# Patient Record
Sex: Female | Born: 1970 | Hispanic: No | Smoking: Never smoker
Health system: Southern US, Community
[De-identification: ages and names within clinical notes are randomized; demographics above are authoritative.]

## PROBLEM LIST (undated history)

## (undated) DIAGNOSIS — I639 Cerebral infarction, unspecified: Secondary | ICD-10-CM

## (undated) DIAGNOSIS — R51 Headache: Secondary | ICD-10-CM

## (undated) DIAGNOSIS — R251 Tremor, unspecified: Secondary | ICD-10-CM

## (undated) DIAGNOSIS — E669 Obesity, unspecified: Secondary | ICD-10-CM

## (undated) DIAGNOSIS — IMO0002 Reserved for concepts with insufficient information to code with codable children: Secondary | ICD-10-CM

## (undated) DIAGNOSIS — G4700 Insomnia, unspecified: Secondary | ICD-10-CM

## (undated) DIAGNOSIS — M1990 Unspecified osteoarthritis, unspecified site: Secondary | ICD-10-CM

## (undated) DIAGNOSIS — F418 Other specified anxiety disorders: Secondary | ICD-10-CM

## (undated) DIAGNOSIS — G8929 Other chronic pain: Secondary | ICD-10-CM

## (undated) DIAGNOSIS — F419 Anxiety disorder, unspecified: Secondary | ICD-10-CM

## (undated) DIAGNOSIS — H9190 Unspecified hearing loss, unspecified ear: Secondary | ICD-10-CM

## (undated) DIAGNOSIS — IMO0001 Reserved for inherently not codable concepts without codable children: Principal | ICD-10-CM

## (undated) DIAGNOSIS — R519 Headache, unspecified: Secondary | ICD-10-CM

## (undated) DIAGNOSIS — R569 Unspecified convulsions: Secondary | ICD-10-CM

## (undated) HISTORY — DX: Headache: R51

## (undated) HISTORY — DX: Anxiety disorder, unspecified: F41.9

## (undated) HISTORY — DX: Other chronic pain: G89.29

## (undated) HISTORY — PX: FRACTURE SURGERY: SHX138

## (undated) HISTORY — DX: Unspecified convulsions: R56.9

## (undated) HISTORY — DX: Other specified anxiety disorders: F41.8

## (undated) HISTORY — PX: ANKLE SURGERY: SHX546

## (undated) HISTORY — PX: TUBAL LIGATION: SHX77

## (undated) HISTORY — DX: Insomnia, unspecified: G47.00

## (undated) HISTORY — DX: Headache, unspecified: R51.9

## (undated) HISTORY — DX: Unspecified osteoarthritis, unspecified site: M19.90

## (undated) HISTORY — DX: Reserved for concepts with insufficient information to code with codable children: IMO0002

## (undated) HISTORY — DX: Unspecified hearing loss, unspecified ear: H91.90

## (undated) HISTORY — DX: Reserved for inherently not codable concepts without codable children: IMO0001

## (undated) HISTORY — DX: Obesity, unspecified: E66.9

## (undated) HISTORY — DX: Tremor, unspecified: R25.1

---

## 2004-05-01 ENCOUNTER — Ambulatory Visit: Payer: Self-pay | Admitting: Family Medicine

## 2004-05-17 ENCOUNTER — Emergency Department (HOSPITAL_COMMUNITY): Admission: EM | Admit: 2004-05-17 | Discharge: 2004-05-17 | Payer: Self-pay | Admitting: Family Medicine

## 2004-07-07 ENCOUNTER — Emergency Department (HOSPITAL_COMMUNITY): Admission: EM | Admit: 2004-07-07 | Discharge: 2004-07-07 | Payer: Self-pay | Admitting: Family Medicine

## 2005-08-11 ENCOUNTER — Emergency Department (HOSPITAL_COMMUNITY): Admission: EM | Admit: 2005-08-11 | Discharge: 2005-08-11 | Payer: Self-pay | Admitting: Emergency Medicine

## 2005-12-10 ENCOUNTER — Emergency Department (HOSPITAL_COMMUNITY): Admission: EM | Admit: 2005-12-10 | Discharge: 2005-12-10 | Payer: Self-pay | Admitting: Family Medicine

## 2006-06-25 ENCOUNTER — Emergency Department (HOSPITAL_COMMUNITY): Admission: EM | Admit: 2006-06-25 | Discharge: 2006-06-25 | Payer: Self-pay | Admitting: Emergency Medicine

## 2006-07-27 ENCOUNTER — Ambulatory Visit (HOSPITAL_COMMUNITY): Admission: RE | Admit: 2006-07-27 | Discharge: 2006-07-27 | Payer: Self-pay | Admitting: *Deleted

## 2007-07-23 ENCOUNTER — Emergency Department (HOSPITAL_COMMUNITY): Admission: EM | Admit: 2007-07-23 | Discharge: 2007-07-23 | Payer: Self-pay | Admitting: Emergency Medicine

## 2007-08-24 ENCOUNTER — Emergency Department (HOSPITAL_COMMUNITY): Admission: EM | Admit: 2007-08-24 | Discharge: 2007-08-24 | Payer: Self-pay | Admitting: Family Medicine

## 2008-02-18 ENCOUNTER — Emergency Department (HOSPITAL_COMMUNITY): Admission: EM | Admit: 2008-02-18 | Discharge: 2008-02-18 | Payer: Self-pay | Admitting: Emergency Medicine

## 2008-06-07 ENCOUNTER — Inpatient Hospital Stay (HOSPITAL_COMMUNITY): Admission: EM | Admit: 2008-06-07 | Discharge: 2008-06-10 | Payer: Self-pay | Admitting: Emergency Medicine

## 2008-09-09 ENCOUNTER — Emergency Department (HOSPITAL_COMMUNITY): Admission: EM | Admit: 2008-09-09 | Discharge: 2008-09-09 | Payer: Self-pay | Admitting: Emergency Medicine

## 2010-05-02 LAB — URINALYSIS, ROUTINE W REFLEX MICROSCOPIC
Glucose, UA: NEGATIVE mg/dL
Hgb urine dipstick: NEGATIVE
Specific Gravity, Urine: 1.021 (ref 1.005–1.030)

## 2010-05-02 LAB — URINE MICROSCOPIC-ADD ON

## 2010-05-02 LAB — POCT PREGNANCY, URINE: Preg Test, Ur: NEGATIVE

## 2010-05-05 LAB — CBC
MCHC: 34.7 g/dL (ref 30.0–36.0)
RDW: 12.3 % (ref 11.5–15.5)

## 2010-05-05 LAB — DIFFERENTIAL
Eosinophils Absolute: 0.1 10*3/uL (ref 0.0–0.7)
Lymphocytes Relative: 26 % (ref 12–46)
Lymphs Abs: 1.5 10*3/uL (ref 0.7–4.0)
Monocytes Relative: 5 % (ref 3–12)
Neutrophils Relative %: 68 % (ref 43–77)

## 2010-05-05 LAB — FOLATE RBC: RBC Folate: 648 ng/mL — ABNORMAL HIGH (ref 180–600)

## 2010-05-05 LAB — VITAMIN B12: Vitamin B-12: 369 pg/mL (ref 211–911)

## 2010-05-05 LAB — PHENYTOIN LEVEL, TOTAL: Phenytoin Lvl: <2.5 ug/mL — ABNORMAL LOW (ref 10.0–20.0)

## 2010-05-05 LAB — TISSUE CULTURE

## 2010-05-05 LAB — COMPREHENSIVE METABOLIC PANEL
ALT: 22 U/L (ref 0–35)
AST: 22 U/L (ref 0–37)
Calcium: 8.9 mg/dL (ref 8.4–10.5)
GFR calc Af Amer: >60 mL/min (ref >60)
Sodium: 137 mEq/L (ref 135–145)
Total Protein: 6.3 g/dL (ref 6.0–8.3)

## 2010-05-05 LAB — URINALYSIS, ROUTINE W REFLEX MICROSCOPIC
Glucose, UA: NEGATIVE mg/dL
Hgb urine dipstick: NEGATIVE
Protein, ur: NEGATIVE mg/dL
pH: 7.5 (ref 5.0–8.0)

## 2010-05-05 LAB — GLUCOSE, CAPILLARY: Glucose-Capillary: 88 mg/dL (ref 70–99)

## 2010-05-05 LAB — RAPID URINE DRUG SCREEN, HOSP PERFORMED
Amphetamines: NOT DETECTED
Barbiturates: NOT DETECTED
Opiates: NOT DETECTED

## 2010-05-05 LAB — TSH: TSH: 3.865 u[IU]/mL (ref 0.350–4.500)

## 2010-05-05 LAB — URINE MICROSCOPIC-ADD ON

## 2010-05-05 LAB — RPR: RPR Ser Ql: NONREACTIVE
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
## 2010-06-09 NOTE — Discharge Summary (Signed)
Heather Simon, Heather Simon                ACCOUNT NO.:  [id number]   MEDICAL RECORD NO.:  [id number]          PATIENT TYPE:  INP   LOCATION:  1311                         FACILITY:  [facility]   PHYSICIAN:  Hind I Elsaid, MD      DATE OF BIRTH:  18-Nov-1970   DATE OF ADMISSION:  06/06/2008  DATE OF DISCHARGE:  06/10/2008                               DISCHARGE SUMMARY   DISCHARGE DIAGNOSES:  1. New onset seizures.  2. Migraine headache.  3. Methicillin-resistant Staphylococcus aureus/gram negative rods, ear      canals.  4. 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   DISCHARGE MEDICATIONS:  1. Amitriptyline 50 mg p.o. daily.  2. Topamax 25 mg p.o. daily until May 21, then increase to 50 mg p.o.      until May 28, then 75 mg p.o. for 1 week, then 100 mg p.o. daily.  3. Bactrim DS 1 tab twice daily.  4. Percocet 5/325 mg p.o. q.4 to 6 hours p.r.n.  5. Cortisporin ear drop q.i.d.   CONSULTATION:  Neurology consulted and ENT consulted.   HISTORY OF PRESENT ILLNESS:  This is a 40 year old female with a history  of some depression having headache in the last couple days and had an  observed seizure at home.  The patient admitted for evaluation of  seizures.  Had EEG which showed abnormal EEG due to a slow wave complex  form the bifrontal.  This suggested lowered seizure threshold, overall  no electrographic seizure was recorded.  MRI of the brain with no acute  abnormality but did show left middle ear effusion, left mastoid sinus  effusion.  The right mastoid sinus is clear.  No nasopharyngeal mass is  seen that could be causing obstruction of the eustachian tube on the  left.  CT head did show a 6 mm hypodense lesion in the left parietal  periventricular white matter.  This may represent periventricular space.  Cannot exclude inflammatory focus or a small mass.  I would recommend  followup MRI of the brain with and without contrast.  There is some  fullness of  the cerebellar fossa and the foramen magnum.  She had a  malformation.  Left otitis media with multiple left mastoid air cells.   HOSPITAL COURSE:  1. New onset seizures.  The patient admitted under seizure      precautions.  Neurologic consulted.  Recommendation was MRI of the      brain and EEG.  The patient was apparently started on dilantin, but      secondary to severe headache, recommendation was to proceed with      Topamax.  The patient now on Topamax 25 mg daily.  The patient had      what felt like another seizure on May 16 described as light      shocking, light headache, with no loss of consciousness and no      clear stimulation or confusion.  For that, discharge was held for      24 hours with no further episode, and  neurology evaluated the      patient and recommended to continue with the current management of      nausea and continue Topamax as well.  The patient, today, more      awake.  Has no further symptoms.  The patient will be discharged      with Topamax and Elavil, and need to follow up with a neurologist      within 4 weeks.  2. otitis media and mastoiditis on  the left.  ENT seen.  Reviewed the      consult.  Culture did grow staphylococcus aureus with some gram      negative rods.  We avoid in this case Avelox and Levaquin because      of decrease of seizure threshold.  For that, the patient will be      discharged with Bactrim with Cortisporin ear drop.      Arrangement was made to see Dr. Lazarus Salines from ENT to see him after      completing a course of Bactrim and Cortisporin.  .  At this time,      the patient medically stable to be discharged home.  The patient      will need to follow up with neurology in 4 weeks.  Follow up with      ENT in 10 days.      Hind Bosie Helper, MD  Electronically Signed     HIE/MEDQ  D:  06/10/2008  T:  06/10/2008  Job:  [id number]

## 2010-06-09 NOTE — Consult Note (Signed)
NAMEMarland Kitchen  Heather Simon, Heather Simon NO.:  [id number]   MEDICAL RECORD NO.:  [id number]          PATIENT TYPE:  INP   LOCATION:  1311                         FACILITY:  [facility]   PHYSICIAN:  Heather Simon, M.D.  DATE OF BIRTH:  04/22/1970   DATE OF CONSULTATION:  06/07/2008  DATE OF DISCHARGE:                                 CONSULTATION   CHIEF COMPLAINT:  Seizures.   HISTORY OF PRESENT ILLNESS:  This is a pleasant 40 year old female who  is left handed.  The patient states that she has been under significant  stress over the last 4-6 months at work and at home.  The patient has  had a daily headache that is circumferential in nature, throbbing, and  has component of photophobia, phonophobia, and scotoma.  Scotoma is  described as white flashy lights in the left peripheral field.  The  patient states that the headaches do increase in severity throughout the  day, it is markedly worse by the end of work.  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  The patient states that Tylenol does not help in  decreasing the pain of her headaches.  She has recently been to [facility], received a medication for headaches, however, does  not recall what the name of the medication is.  She has had this  medication at this time.  She does not have a primary care Heather Simon and  has not followed up with any physician for her chronic headache.  Of  note, at the time that she had seen [facility], she was also  started on Paxil 10 mg daily for depression and anxiety.   Talking to the daughter, daughter states that the patient often times  come home, go into a dark room, put a towel over her head, and lay still  for significant periods of time.  Daughter also states that the patient  is under significant stress and is reluctant to seek help for both her  headache and anxiety.  In addition to this  headache component, the  patient also states that she has had 3-4 seizures in the past 4-6  months, last seizure that occurred was yesterday and prior to that was  approximately 2 weeks before.  All these seizures except for one have  been unwitnessed.  The one witnessed seizure occurred approximately 2  weeks ago in which her daughter states that her right hand started  shaking at that time.  At that time, the hand started shaking, the  patient tried to stop her hand from shaking and was aware that her hand  was shaking.  Daughter states that she noted her mother's eyes rolled  back.  There was no urinary incontinence.  No tongue biting.  No tonic-  clonic movement.  Seizure lasted for approximately 10 minutes with a  period of significant drowsiness and cnfusion,and significant tired  sensation.  This past most recent seizure that occurred yesterday, the  patient was found  at home by her son, after seizure event had occurred.  She does not recall any of the seizure event and does not recall the EMS  ride to the hospital.  At present time, she is alert, she is oriented,  and showing no signs of postictal or lethargy.   PAST MEDICAL HISTORY:  Significant for depression which she started on  Paxil, March 2010.   MEDICATIONS:  1. Paxil 10 mg daily.  2. Amitriptyline 25 mg every night.   ALLERGIES:  None.   FAMILY HISTORY:  Negative for hypertension, diabetes, CVA.  Positive for  benign essential tremor.   SOCIAL HISTORY:  The patient does not drink or use drugs.  Lives in  [place].  She has 2 children and often times open her house to  multiple other people.  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   REVIEW OF SYSTEMS:  Negative.   PHYSICAL EXAMINATION:  VITAL SIGNS:  Blood pressure is 108/66, pulse  120, respiratory rate 20, and temperature 97.8.  MENTAL  STATUS:  She is alert.  She is oriented.  She carries out 2 and 3-  step commands without any difficulty.  She is well enunciated.  She has  good recall.  PULMONARY:  Clear to auscultation.  CARDIOVASCULAR:  S1 and S2.  Regular rate and rhythm.  NECK:  Negative bruits and supple.  ABDOMEN:  Soft, nontender, nondistended.  NEUROLOGIC:  Cranial nerves II-XII are grossly intact.  Pupils are  equal, round, reactive and accommodating to light approximately 3 mm to  2 mm conjugate.  Extraocular muscles are grossly intact.  Visual fields  are grossly intact.  Symmetrical face.  Tongue is midline.  Uvula is  midline.  Sensation V1 through V3 is full.  Pinprick to light touch.  Shoulder shrug and head turn is full, within normal range.  No  meningismus noted.  Coordination; cerebellar; finger-to-nose, and heel-  to-shin are smooth with no dysmetria.  Fine motor movements are within  normal limits.  Gait is slow, however, she has good arm swing in a  narrow base.  Motor is 5/5 globally.  She does have a small fast Hertz  tremor in her left hand and states this often occurs when she is  nervous, but does not occur when she is at rest.  The patient shows no  muscle wasting or fasciculations at this time.   Deep tendon reflexes are 2+ throughout with bilateral downgoing toes.  Drift is negative.  Sensation is full to pinprick, light touch, and  vibration.  Proprioception and stereognosis are grossly intact.   LABS:  UA is positive for nitrites and leukocytes.  Sodium is 137,  potassium 4.0, chloride 107, CO2 is 23, BUN is 15, creatinine is 0.75,  glucose is 101, white blood cell 6.0, platelets 201,  hemoglobin/hematocrit 13.2 and 38.2.   IMAGING TEST:  CT of the head shows a 6-mm hypodense lesion in the left  periventricular region, mostly likely lacunar infarct of unknown onset,  old lacunar infarct.   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  At this time, we would definitely recommend EEG.  We discontinued Cipro  and changed to Bactrim DS for urinary tract infection.  Cipro can  decrease seizure threshold and would also add Topamax 50 mg daily which  can both aid in headache and seizure prophylaxis.  I will discuss this  with Dr. Anne Hahn.      ______________________________  Felicie Morn, PA-C      C. Lesia Sago, M.D.  Electronically Signed   DS/MEDQ  D:  06/07/2008  T:  06/08/2008  Job:  [id number]   cc:   Heather Simon, M.D.  Fax: [phone]

## 2010-06-09 NOTE — Procedures (Signed)
EEG NUMBER:  [id number]   HISTORY:  This is a 40 year old patient with history of headaches and  seizure-type events.  The patient reports three seizures in the last 2  months.  The patient to be evaluated for these seizures.  Medications  include Elavil, Augmentin, Dilaudid, Tylenol.   EEG CLASSIFICATION:  Dysrhythmia grade 3 bifrontal.   DESCRIPTION OF THE RECORDING:  Background rhythm of this recording  consists of fairly well-modulated medium amplitude alpha rhythm of 10 Hz  that is reactive to eye opening and closure.  As the record progresses,  the patient appears to be drowsy off and on during the recording, but on  one occasion, there appears to be sharp and slow wave complexes  emanating from the frontal regions bilaterally.  This is not seen again  throughout the recording.  Photic stimulation and hyperventilation were  not performed.  In no time during the recording does the patient  appeared to enter stage II sleep or does there appear to be evidence of  spikes or does there appear to be evidence of focal slowing.  EKG  monitor shows no evidence of cardiac rhythm abnormalities with a heart  rate of 90.   IMPRESSION:  This is an abnormal EEG recording due to sharp and slow  wave complexes emanating from the bifrontal regions.  This study would  suggest a lowered seizure threshold, although no electrographic seizures  were recorded during this study.      Marlan Palau, M.D.  Electronically Signed     JJO:ACZY  D:  06/07/2008 17:09:16  T:  06/08/2008 04:48:18  Job #:  [id number]

## 2010-06-09 NOTE — H&P (Signed)
NAME:  Heather Simon, Heather Simon NO.:  [id number]   MEDICAL RECORD NO.:  [id number]          PATIENT TYPE:  INP   LOCATION:  1311                         [facility]ACILITY:  [facility]   PHYSICIAN:  Lonia Blood, M.D.      DATE OF BIRTH:  [birth year]   DATE OF ADMISSION:  06/06/2008  DATE OF DISCHARGE:                              HISTORY & PHYSICAL   PRIMARY CARE PHYSICIAN:  The patient is unassigned to [place].   PRESENTING COMPLAINT:  Seizure.   HISTORY OF PRESENT ILLNESS:  The patient is a 40 year old female with  history of some depression who has apparently been having headaches  repeatedly in the last couple of days and had an observed seizure at  home.  Per patient, she has had at least 3 different seizure episodes in  the last 2 months but ignored them, not going to see any physician.  She  denied any fever.  Currently awake, alert, no postictal symptoms.  Able  to communicate.  Her main issue is the headache that is severe and  continuous.  She had no observed seizure in the ED.  Neurology has  already been consulted, and Dr. Vickey Huger promised to see the patient in  the morning.   Her past medical history is significant for depression and headache.   ALLERGIES:  She has no known drug allergies.   MEDICATIONS:  Include:  1. Paxil 10 mg daily.  2. Amitriptyline 25 mg every night.   SOCIAL HISTORY:  The patient lives in [place].  She denied any  tobacco, alcohol or IV drug use.   FAMILY HISTORY:  Denied any family history of cancers or any brain  tumors.   REVIEW OF SYSTEMS:  A 12-point review of systems is negative except per  HPI.   PHYSICAL EXAMINATION:  Temperature 98.3, blood pressure 130/79, initial  pulse 102, respiratory rate 20, saturations 100% on room air.  GENERAL:  The patient is currently awake, alert, oriented, communicating  in no acute distress.  HEENT:  PERRL.  EOMI.  NECK:  Is supple.  No JVD.  No lymphadenopathy.  RESPIRATORY:  She has good air entry  bilaterally.  No wheezes.  CARDIOVASCULAR SYSTEM:  The patient has S1-S2.  No murmur.  ABDOMEN:  Soft and nontender with positive bowel sounds.  EXTREMITIES:  No edema, cyanosis or clubbing.   LABORATORY DATA:  White count 6.0, hemoglobin 13.2 with platelet count  of 201.  Sodium 137, potassium 4.7, chloride 107, CO2 23, glucose 101,  BUN 15, creatinine 0.75, normal differentials.  Normal LFTs with a  calcium of 8.9.  Alcohol is less than 5.  Urine drug screen is negative.  Urinalysis showed cloudy urine with positive leukocyte esterase which is  moderate.  Urine WBC 0-2 with many bacteria.  Urine pregnancy is  negative.  Head CT without contrast showed a 6-mm hypodense lesion in  the left parietal periventricular white matter which may represent a  dilated perivascular space or small remote lacunar infarct.  A small  mass may be possible.  Recommendation for an MRI.  The patient also  has  some fullness of the cerebellar tonsils at the foramen magnum as well as  left otitis media with opacification of multiple left mastoid air cells.   ASSESSMENT:  Therefore, this is a 40 year old female presenting with  what appears to be new-onset seizure within the last 2 months.  The  cause is clearly unknown but there is a possibility of brain mass.  If  so, this could be the focal point for her seizure.  Also has depression  and possible UTI.  The severe headaches could be related to her  intracranial lesion or could be some type of complex migraine.   PLAN:  Therefore will be:  1. New-onset seizure.  The patient has already had Dilantin in the ED.      Will check a level today.  Will continue on oral Dilantin every      night.  Neurology has been consulted, and Dr. Vickey Huger promised to      see the patient this morning and will wait for her to do a formal      consult.  2. Possible brain mass.  We will proceed with MRI to evaluate the      lesion seen on CT.  3. Depression.  Will continue with  her home therapy as much as      possible.  4. Insomnia.  I will continue with Elavil.  5. Headaches.  Will continue with pain control using both Tylenol as      well as Dilaudid as needed.  6. Bacteriuria.  The patient is asymptomatic.  However, at her age,      she is a little bit too young for asymptomatic bacteriuria.  With a      moderate leukocyte esterase, I will treat her empirically with oral      Cipro for 3 days.  Further treatment will depend on the results of      MRI.      Lonia Blood, M.D.  Electronically Signed     LG/MEDQ  D:  06/07/2008  T:  06/07/2008  Job:  [id number]

## 2010-10-23 LAB — WET PREP, GENITAL
Clue Cells Wet Prep HPF POC: NONE SEEN
Yeast Wet Prep HPF POC: NONE SEEN

## 2010-10-23 LAB — POCT URINALYSIS DIP (DEVICE)
Glucose, UA: NEGATIVE
Ketones, ur: 15 — AB
Operator id: 235561
Specific Gravity, Urine: >=1.03

## 2010-10-23 LAB — POCT PREGNANCY, URINE: Preg Test, Ur: NEGATIVE

## 2010-10-23 LAB — GC/CHLAMYDIA PROBE AMP, GENITAL
Chlamydia, DNA Probe: NEGATIVE
GC Probe Amp, Genital: NEGATIVE

## 2010-12-04 ENCOUNTER — Other Ambulatory Visit: Payer: Self-pay | Admitting: Family Medicine

## 2010-12-04 DIAGNOSIS — Z1231 Encounter for screening mammogram for malignant neoplasm of breast: Secondary | ICD-10-CM

## 2010-12-23 ENCOUNTER — Other Ambulatory Visit (HOSPITAL_COMMUNITY)
Admission: RE | Admit: 2010-12-23 | Discharge: 2010-12-23 | Disposition: A | Discharge status: Home / Self Care | Payer: 59 | Source: Ambulatory Visit | Attending: Family Medicine | Admitting: Family Medicine

## 2010-12-23 DIAGNOSIS — Z01419 Encounter for gynecological examination (general) (routine) without abnormal findings: Secondary | ICD-10-CM | POA: Insufficient documentation

## 2012-04-26 ENCOUNTER — Encounter (HOSPITAL_COMMUNITY): Payer: Self-pay | Admitting: Emergency Medicine

## 2012-04-26 ENCOUNTER — Emergency Department (HOSPITAL_COMMUNITY)
Admission: EM | Admit: 2012-04-26 | Discharge: 2012-04-26 | Disposition: A | Discharge status: Home / Self Care | Payer: BC Managed Care – PPO | Attending: Emergency Medicine | Admitting: Emergency Medicine

## 2012-04-26 ENCOUNTER — Emergency Department (HOSPITAL_COMMUNITY): Payer: BC Managed Care – PPO

## 2012-04-26 DIAGNOSIS — Z8673 Personal history of transient ischemic attack (TIA), and cerebral infarction without residual deficits: Secondary | ICD-10-CM | POA: Insufficient documentation

## 2012-04-26 DIAGNOSIS — R059 Cough, unspecified: Secondary | ICD-10-CM | POA: Insufficient documentation

## 2012-04-26 DIAGNOSIS — R0609 Other forms of dyspnea: Secondary | ICD-10-CM | POA: Insufficient documentation

## 2012-04-26 DIAGNOSIS — R05 Cough: Secondary | ICD-10-CM | POA: Insufficient documentation

## 2012-04-26 DIAGNOSIS — R0989 Other specified symptoms and signs involving the circulatory and respiratory systems: Secondary | ICD-10-CM | POA: Insufficient documentation

## 2012-04-26 DIAGNOSIS — R509 Fever, unspecified: Secondary | ICD-10-CM | POA: Insufficient documentation

## 2012-04-26 HISTORY — DX: Cerebral infarction, unspecified: I63.9

## 2012-04-26 MED ORDER — ALBUTEROL SULFATE HFA 108 (90 BASE) MCG/ACT IN AERS
2.0000 | INHALATION_SPRAY | RESPIRATORY_TRACT | Status: DC | PRN
  Filled 2012-04-26: qty 6.7

## 2012-04-26 MED ORDER — ACETAMINOPHEN 325 MG PO TABS
650.0000 mg | ORAL_TABLET | Freq: Once | ORAL | Status: AC
  Administered 2012-04-26: 650 mg via ORAL
  Filled 2012-04-26: qty 2

## 2012-04-26 MED ORDER — PREDNISONE 20 MG PO TABS: 40.0000 mg | ORAL_TABLET | Freq: Every day | ORAL | Status: DC

## 2012-04-26 MED ORDER — ALBUTEROL SULFATE (5 MG/ML) 0.5% IN NEBU
2.5000 mg | INHALATION_SOLUTION | Freq: Once | RESPIRATORY_TRACT | Status: AC
  Administered 2012-04-26: 2.5 mg via RESPIRATORY_TRACT
  Filled 2012-04-26: qty 0.5

## 2012-04-26 MED ORDER — ALBUTEROL SULFATE HFA 108 (90 BASE) MCG/ACT IN AERS: 2.0000 | INHALATION_SPRAY | RESPIRATORY_TRACT | Status: DC | PRN

## 2012-04-26 MED ORDER — IPRATROPIUM BROMIDE 0.02 % IN SOLN
0.5000 mg | Freq: Once | RESPIRATORY_TRACT | Status: AC
  Administered 2012-04-26: 0.5 mg via RESPIRATORY_TRACT
  Filled 2012-04-26: qty 2.5

## 2012-04-26 NOTE — ED Notes (Signed)
Spoke with respiratory therapy regarding breathing treatment.

## 2012-04-26 NOTE — ED Provider Notes (Signed)
History     CSN: [id number]  Arrival date & time 04/26/12  1621   First MD Initiated Contact with Patient 04/26/12 1633      Chief Complaint  Patient presents with  . Shortness of Breath    (Consider location/radiation/quality/duration/timing/severity/associated sxs/prior treatment) HPI Comments: This is a 42 year old female, no pertinent past medical history, who presents emergency department with chief complaint of cough and shortness of breath. Patient states that 2 weeks ago she had fever, cough, and chills. She states that she got better, but that she has still been coughing. She states that for the past week or so, the cough has been worsening, and that for the past 4 days she has had difficulty breathing. She is tried taking Mucinex with no relief. She states that her level of discomfort is moderate. Currently, she denies fever, chills, or productive cough. She states that she has never smoked. She has no history of asthma.  The history is provided by the patient. No language interpreter was used.    Past Medical History  Diagnosis Date  . Stroke     Past Surgical History  Procedure Laterality Date  . Tubal ligation    . Ankle surgery      No family history on file.  History  Substance Use Topics  . Smoking status: Never Smoker   . Smokeless tobacco: Not on file  . Alcohol Use: No    OB History   Grav Para Term Preterm Abortions TAB SAB Ect Mult Living                  Review of Systems  All other systems reviewed and are negative.    Allergies  Review of patient's allergies indicates no known allergies.  Home Medications   Current Outpatient Rx  Name  Route  Sig  Dispense  Refill  . guaiFENesin (MUCINEX) 600 MG 12 hr tablet   Oral   Take 1,200 mg by mouth as needed for congestion.         Marland Kitchen ibuprofen (ADVIL,MOTRIN) 200 MG tablet   Oral   Take 200 mg by mouth daily.         . Multiple Vitamin (MULTIVITAMIN WITH MINERALS) TABS   Oral   Take  1 tablet by mouth daily.           BP 126/72  Pulse 103  Temp(Src) 98.9 F (37.2 C) (Oral)  SpO2 100%  LMP 03/30/2012  Physical Exam  Nursing note and vitals reviewed. Constitutional: She is oriented to person, place, and time. She appears well-developed and well-nourished.  HENT:  Head: Normocephalic and atraumatic.  Eyes: Conjunctivae and EOM are normal. Pupils are equal, round, and reactive to light.  Neck: Normal range of motion. Neck supple.  Cardiovascular: Normal rate, regular rhythm and normal heart sounds.  Exam reveals no gallop and no friction rub.   No murmur heard. Pulmonary/Chest: Effort normal and breath sounds normal. No respiratory distress. She has no wheezes. She has no rales. She exhibits no tenderness.  No wheezes, rhonchi or rales  Abdominal: Soft. Bowel sounds are normal. She exhibits no distension and no mass. There is no tenderness. There is no rebound and no guarding.  Musculoskeletal: Normal range of motion. She exhibits no edema and no tenderness.  Neurological: She is alert and oriented to person, place, and time.  Skin: Skin is warm and dry.  Psychiatric: She has a normal mood and affect. Her behavior is normal. Judgment and thought  content normal.    ED Course  Procedures (including critical care time)  Labs Reviewed - No data to display Dg Chest 2 View  04/26/2012  *RADIOLOGY REPORT*  Clinical Data: Shortness of breath  CHEST - 2 VIEW  Comparison: None.  Findings: Normal heart size.  No effusion or edema.  No airspace consolidation identified.  Indeterminant nodular density in the left upper lobe measures 9 mm.  Review of the visualized osseous structures is remarkable.  IMPRESSION:  1.  Left upper lobe nodule measures 9 mm.  Recommend follow-up, non emergent CT of the chest. 2.  No acute findings.   Original Report Authenticated By: Signa Kell, M.D.      1. Cough       MDM  42 year old female with cough and SOB.  Plain films show no  evidence of pneumonia.  Incidental 9mm nodule found on CXR, which will need CT for further evaluation.  This may be done on a non-emergent basis.  Patient feels better after the nebulizer treatment.  6:44 PM Discussed with Dr. Effie Shy, who tells me that I can give the patient a breathing treatment, and then send her home with a short steroid course and an inhaler.  I discussed the importance of following up with a PCP for a CT chest follow up.  Patient understands and agrees with the plan.        Roxy Horseman, PA-C 04/26/12 1850

## 2012-04-26 NOTE — ED Notes (Signed)
Patient transported to X-ray 

## 2012-04-26 NOTE — ED Notes (Signed)
Pt complains of non productive cough and shortness of breath. Pt reports that "a week ago I had flu like symptoms, that has gotten better, but then this started"

## 2012-04-26 NOTE — ED Notes (Signed)
MD at bedside. 

## 2012-04-27 NOTE — ED Provider Notes (Signed)
Medical screening examination/treatment/procedure(s) were performed by non-physician practitioner and as supervising physician I was immediately available for consultation/collaboration.  Flint Melter, MD 04/27/12 [phone]

## 2012-05-07 ENCOUNTER — Ambulatory Visit: Payer: BC Managed Care – PPO

## 2012-05-07 ENCOUNTER — Encounter: Payer: Self-pay | Admitting: Family Medicine

## 2012-05-07 ENCOUNTER — Ambulatory Visit (INDEPENDENT_AMBULATORY_CARE_PROVIDER_SITE_OTHER): Payer: BC Managed Care – PPO | Admitting: Family Medicine

## 2012-05-07 VITALS — BP 113/76 | HR 73 | Temp 97.9°F | Resp 16 | Ht 67.5 in | Wt 210.2 lb

## 2012-05-07 DIAGNOSIS — R918 Other nonspecific abnormal finding of lung field: Secondary | ICD-10-CM

## 2012-05-07 DIAGNOSIS — R9389 Abnormal findings on diagnostic imaging of other specified body structures: Secondary | ICD-10-CM

## 2012-05-07 DIAGNOSIS — M25552 Pain in left hip: Secondary | ICD-10-CM

## 2012-05-07 DIAGNOSIS — R220 Localized swelling, mass and lump, head: Secondary | ICD-10-CM

## 2012-05-07 DIAGNOSIS — M25559 Pain in unspecified hip: Secondary | ICD-10-CM

## 2012-05-07 DIAGNOSIS — Z803 Family history of malignant neoplasm of breast: Secondary | ICD-10-CM

## 2012-05-07 DIAGNOSIS — R221 Localized swelling, mass and lump, neck: Secondary | ICD-10-CM

## 2012-05-07 LAB — TSH: TSH: 1.552 u[IU]/mL (ref 0.350–4.500)

## 2012-05-07 LAB — POCT SEDIMENTATION RATE: POCT SED RATE: 20 mm/hr (ref 0–22)

## 2012-05-07 MED ORDER — METHYLPREDNISOLONE 4 MG PO KIT: PACK | ORAL | Status: DC

## 2012-05-07 NOTE — Progress Notes (Signed)
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 Heather Simon has 4 children age 20,19, 58,12. Her ex-husband does not help her in any way. Heather Simon has a sister has cervical cancer that has metastasized to her neck. Mother had breast cancer.  Patient was in the emergency room 2 weeks ago for a cough and at that time was found to have a nodule in the right upper lobe measuring 9 mm. Heather Simon was told Heather Simon needed a CAT scan.  Heather Simon's had left hip pain for month. This occurs in primarily at night but also when going up and downstairs. Heather Simon's had no injury. Heather Simon notes that Heather Simon has soreness in her fingers with some intermittent swelling of the PIP joints. Her father had rheumatoid arthritis. Patient has not had insurance until recently.  Patient's concern that Heather Simon may have breast cancer or some other form of cancer. Heather Simon noted back in October that Heather Simon had a nodule in her neck which is persisted. This has not enlarged recently. The area of the nodule is in the anterior left side at the insertion of the sternocleidomastoid muscle.  Patient's never had a mammogram.  Objective: Patient appears somewhat anxious but has good eye contact and is appropriate HEENT unremarkable Neck: Suggestion of half centimeter nodule at the base of the left sternocleidomastoid. No thyromegaly. No other adenopathy. Chest: Clear Heart: Regular no murmur Skin: Warm and dry Hip: Pain with internal and external rotation on the left side. Straight leg raising normal. Flexion and extension seem to be normal. Right hip is normal [facility] reading (PRIMARY) by  Dr. Milus Glazier:  No significant bony erosion, joint space narrowing or deformity  Assessment: 1 significant anxiety and stress 2 left hip pain most likely a form of arthritis 3 right upper lobe nodule in lung field 4 positive family history for breast and cervical cancer in patient needs screening.  Plan:  Abnormal finding on chest xray - Plan: CT Chest Wo  Contrast  Hip pain, left - Plan: DG Hip Complete Left, POCT SEDIMENTATION RATE, TSH, Rheumatoid factor, ANA  Neck nodule - Plan: TSH  Family history of breast cancer - Plan: MM Digital Screening   Results for orders placed in visit on 05/07/12  POCT SEDIMENTATION RATE      Result Value Range   POCT SED RATE 20  0 - 22 mm/hr

## 2012-05-07 NOTE — Progress Notes (Signed)
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  She was told she has a nodule in right chest 11 days ago in ED when she presented for a cough.    Patient is a single mom  She has had a month of left hip aching, sometimes waking her at night.  Worse with walking up and down stairs and at night.  No trauma hx.  Some finger aching and right hip sometimes aches.  Father had arthritis (rheumatoid)  She is concerned about the nodule and aching because her younger sister has been diagnosed with breast cancer

## 2012-05-07 NOTE — Patient Instructions (Signed)
Mammography Mammography is an X-ray of the breasts to look for changes that are not normal. The X-ray image is called a mammogram. This procedure can screen for breast cancer, can detect cancer early, and can diagnose cancer.  LET YOUR CAREGIVER KNOW ABOUT:  Breast implants.  Previous breast disease, biopsy, or surgery.  If you are breastfeeding.  Medicines taken, including vitamins, herbs, eyedrops, over-the-counter medicines, and creams.  Use of steroids (by mouth or creams).  Possibility of pregnancy, if this applies. RISKS AND COMPLICATIONS  Exposure to radiation, but at very low levels.  The results may be misinterpreted.  The results may not be accurate.  Mammography may lead to further tests.  Mammography may not catch certain cancers. BEFORE THE PROCEDURE  Schedule your test about 7 days after your menstrual period. This is when your breasts are the least tender and have signs of hormone changes.  If you have had a mammography done at a different facility in the past, get the mammogram X-rays or have them sent to your current exam facility in order to compare them.  Wash your breasts and under your arms the day of the test.  Do not wear deodorants, perfumes, or powders anywhere on your body.  Wear clothes that you can change in and out of easily. PROCEDURE Relax as much as possible during the test. Any discomfort during the test will be very brief. The test should take less than 30 minutes. The following will happen:  You will undress from the waist up and put on a gown.  You will stand in front of the X-ray machine.  Each breast will be placed between 2 plastic or glass plates. The plates will compress your breast for a few seconds.  X-rays will be taken from different angles of the breast. AFTER THE PROCEDURE  The mammogram will be examined.  Depending on the quality of the images, you may need to repeat certain parts of the test.  Ask when your test  results will be ready. Make sure you get your test results.  You may resume normal activities. Document Released: 01/09/2000 Document Revised: 04/05/2011 Document Reviewed: 11/01/2010 [facility] Patient Information 2013 [place], [place]. Health Maintenance, Females A healthy lifestyle and preventative care can promote health and wellness.  Maintain regular health, dental, and eye exams.  Eat a healthy diet. Foods like vegetables, fruits, whole grains, low-fat dairy products, and lean protein foods contain the nutrients you need without too many calories. Decrease your intake of foods high in solid fats, added sugars, and salt. Get information about a proper diet from your caregiver, if necessary.  Regular physical exercise is one of the most important things you can do for your health. Most adults should get at least 150 minutes of moderate-intensity exercise (any activity that increases your heart rate and causes you to sweat) each week. In addition, most adults need muscle-strengthening exercises on 2 or more days a week.   Maintain a healthy weight. The body mass index (BMI) is a screening tool to identify possible weight problems. It provides an estimate of body fat based on height and weight. Your caregiver can help determine your BMI, and can help you achieve or maintain a healthy weight. For adults 20 years and older:  A BMI below 18.5 is considered underweight.  A BMI of 18.5 to 24.9 is normal.  A BMI of 25 to 29.9 is considered overweight.  A BMI of 30 and above is considered obese.  Maintain normal blood lipids  and cholesterol by exercising and minimizing your intake of saturated fat. Eat a balanced diet with plenty of fruits and vegetables. Blood tests for lipids and cholesterol should begin at age 46 and be repeated every 5 years. If your lipid or cholesterol levels are high, you are over 50, or you are a high risk for heart disease, you may need your cholesterol levels checked more  frequently.Ongoing high lipid and cholesterol levels should be treated with medicines if diet and exercise are not effective.  If you smoke, find out from your caregiver how to quit. If you do not use tobacco, do not start.  If you are pregnant, do not drink alcohol. If you are breastfeeding, be very cautious about drinking alcohol. If you are not pregnant and choose to drink alcohol, do not exceed 1 drink per day. One drink is considered to be 12 ounces (355 mL) of beer, 5 ounces (148 mL) of wine, or 1.5 ounces (44 mL) of liquor.  Avoid use of street drugs. Do not share needles with anyone. Ask for help if you need support or instructions about stopping the use of drugs.  High blood pressure causes heart disease and increases the risk of stroke. Blood pressure should be checked at least every 1 to 2 years. Ongoing high blood pressure should be treated with medicines, if weight loss and exercise are not effective.  If you are 23 to 41 years old, ask your caregiver if you should take aspirin to prevent strokes.  Diabetes screening involves taking a blood sample to check your fasting blood sugar level. This should be done once every 3 years, after age 72, if you are within normal weight and without risk factors for diabetes. Testing should be considered at a younger age or be carried out more frequently if you are overweight and have at least 1 risk factor for diabetes.  Breast cancer screening is essential preventative care for women. You should practice "breast self-awareness." This means understanding the normal appearance and feel of your breasts and may include breast self-examination. Any changes detected, no matter how small, should be reported to a caregiver. Women in their 28s and 30s should have a clinical breast exam (CBE) by a caregiver as part of a regular health exam every 1 to 3 years. After age 42, women should have a CBE every year. Starting at age 58, women should consider having a  mammogram (breast X-ray) every year. Women who have a family history of breast cancer should talk to their caregiver about genetic screening. Women at a high risk of breast cancer should talk to their caregiver about having an MRI and a mammogram every year.  The Pap test is a screening test for cervical cancer. Women should have a Pap test starting at age 50. Between ages 50 and 40, Pap tests should be repeated every 2 years. Beginning at age 53, you should have a Pap test every 3 years as long as the past 3 Pap tests have been normal. If you had a hysterectomy for a problem that was not cancer or a condition that could lead to cancer, then you no longer need Pap tests. If you are between ages 59 and 34, and you have had normal Pap tests going back 10 years, you no longer need Pap tests. If you have had past treatment for cervical cancer or a condition that could lead to cancer, you need Pap tests and screening for cancer for at least 20 years after your  treatment. If Pap tests have been discontinued, risk factors (such as a new sexual partner) need to be reassessed to determine if screening should be resumed. Some women have medical problems that increase the chance of getting cervical cancer. In these cases, your caregiver may recommend more frequent screening and Pap tests.  The human papillomavirus (HPV) test is an additional test that may be used for cervical cancer screening. The HPV test looks for the virus that can cause the cell changes on the cervix. The cells collected during the Pap test can be tested for HPV. The HPV test could be used to screen women aged 4 years and older, and should be used in women of any age who have unclear Pap test results. After the age of 45, women should have HPV testing at the same frequency as a Pap test.  Colorectal cancer can be detected and often prevented. Most routine colorectal cancer screening begins at the age of 30 and continues through age 72. However, your  caregiver may recommend screening at an earlier age if you have risk factors for colon cancer. On a yearly basis, your caregiver may provide home test kits to check for hidden blood in the stool. Use of a small camera at the end of a tube, to directly examine the colon (sigmoidoscopy or colonoscopy), can detect the earliest forms of colorectal cancer. Talk to your caregiver about this at age 61, when routine screening begins. Direct examination of the colon should be repeated every 5 to 10 years through age 42, unless early forms of pre-cancerous polyps or small growths are found.  Hepatitis C blood testing is recommended for all people born from [birth year] through 1964 and any individual with known risks for hepatitis C.  Practice safe sex. Use condoms and avoid high-risk sexual practices to reduce the spread of sexually transmitted infections (STIs). Sexually active women aged 17 and younger should be checked for Chlamydia, which is a common sexually transmitted infection. Older women with new or multiple partners should also be tested for Chlamydia. Testing for other STIs is recommended if you are sexually active and at increased risk.  Osteoporosis is a disease in which the bones lose minerals and strength with aging. This can result in serious bone fractures. The risk of osteoporosis can be identified using a bone density scan. Women ages 50 and over and women at risk for fractures or osteoporosis should discuss screening with their caregivers. Ask your caregiver whether you should be taking a calcium supplement or vitamin D to reduce the rate of osteoporosis.  Menopause can be associated with physical symptoms and risks. Hormone replacement therapy is available to decrease symptoms and risks. You should talk to your caregiver about whether hormone replacement therapy is right for you.  Use sunscreen with a sun protection factor (SPF) of 30 or greater. Apply sunscreen liberally and repeatedly throughout the  day. You should seek shade when your shadow is shorter than you. Protect yourself by wearing long sleeves, pants, a wide-brimmed hat, and sunglasses year round, whenever you are outdoors.  Notify your caregiver of new moles or changes in moles, especially if there is a change in shape or color. Also notify your caregiver if a mole is larger than the size of a pencil eraser.  Stay current with your immunizations. Document Released: 07/27/2010 Document Revised: 04/05/2011 Document Reviewed: 07/27/2010 [facility] Patient Information 2013 [place], [place].

## 2012-05-08 LAB — ANA: Anti Nuclear Antibody(ANA): NEGATIVE

## 2012-05-10 ENCOUNTER — Telehealth: Payer: Self-pay

## 2012-05-10 NOTE — Telephone Encounter (Signed)
Called [employer] and got Auth for the CT Scan the auth # [id number]. Good for 30 days. Patient advised she can go today for CT scan as a walk in at [address] [facility] location.

## 2012-05-10 NOTE — Telephone Encounter (Signed)
Patient would like to know if ct results are in , Dr L told her they should be in on Monday  [phone]

## 2012-05-10 NOTE — Telephone Encounter (Signed)
Not done yet. Waiting on insurance approval.

## 2012-05-12 ENCOUNTER — Ambulatory Visit
Admission: RE | Admit: 2012-05-12 | Discharge: 2012-05-12 | Disposition: A | Discharge status: Home / Self Care | Payer: BC Managed Care – PPO | Source: Ambulatory Visit | Attending: Family Medicine | Admitting: Family Medicine

## 2012-05-12 DIAGNOSIS — R9389 Abnormal findings on diagnostic imaging of other specified body structures: Secondary | ICD-10-CM

## 2012-05-12 NOTE — Progress Notes (Signed)
TSH is normal

## 2012-11-20 ENCOUNTER — Other Ambulatory Visit: Payer: Self-pay | Admitting: Lactation Services

## 2012-11-20 DIAGNOSIS — M549 Dorsalgia, unspecified: Secondary | ICD-10-CM

## 2012-11-24 ENCOUNTER — Ambulatory Visit
Admission: RE | Admit: 2012-11-24 | Discharge: 2012-11-24 | Disposition: A | Discharge status: Home / Self Care | Payer: BC Managed Care – PPO | Source: Ambulatory Visit | Attending: Family Medicine | Admitting: Family Medicine

## 2012-11-24 DIAGNOSIS — M549 Dorsalgia, unspecified: Secondary | ICD-10-CM

## 2013-01-03 ENCOUNTER — Encounter: Payer: Self-pay | Admitting: Neurology

## 2013-01-04 ENCOUNTER — Encounter: Payer: Self-pay | Admitting: Neurology

## 2013-01-04 ENCOUNTER — Ambulatory Visit (INDEPENDENT_AMBULATORY_CARE_PROVIDER_SITE_OTHER): Payer: BC Managed Care – PPO | Admitting: Neurology

## 2013-01-04 ENCOUNTER — Encounter (INDEPENDENT_AMBULATORY_CARE_PROVIDER_SITE_OTHER): Payer: Self-pay

## 2013-01-04 VITALS — BP 138/89 | HR 115 | Ht 67.0 in | Wt 226.0 lb

## 2013-01-04 DIAGNOSIS — F418 Other specified anxiety disorders: Secondary | ICD-10-CM

## 2013-01-04 DIAGNOSIS — R259 Unspecified abnormal involuntary movements: Secondary | ICD-10-CM

## 2013-01-04 DIAGNOSIS — R519 Headache, unspecified: Secondary | ICD-10-CM | POA: Insufficient documentation

## 2013-01-04 DIAGNOSIS — G4700 Insomnia, unspecified: Secondary | ICD-10-CM | POA: Insufficient documentation

## 2013-01-04 DIAGNOSIS — R51 Headache: Secondary | ICD-10-CM

## 2013-01-04 DIAGNOSIS — F341 Dysthymic disorder: Secondary | ICD-10-CM

## 2013-01-04 DIAGNOSIS — IMO0001 Reserved for inherently not codable concepts without codable children: Secondary | ICD-10-CM

## 2013-01-04 DIAGNOSIS — R251 Tremor, unspecified: Secondary | ICD-10-CM

## 2013-01-04 DIAGNOSIS — R209 Unspecified disturbances of skin sensation: Secondary | ICD-10-CM

## 2013-01-04 HISTORY — DX: Reserved for inherently not codable concepts without codable children: IMO0001

## 2013-01-04 HISTORY — DX: Other specified anxiety disorders: F41.8

## 2013-01-04 HISTORY — DX: Headache: R51

## 2013-01-04 HISTORY — DX: Insomnia, unspecified: G47.00

## 2013-01-04 HISTORY — DX: Tremor, unspecified: R25.1

## 2013-01-04 MED ORDER — NORTRIPTYLINE HCL 25 MG PO CAPS: ORAL_CAPSULE | ORAL | Status: DC

## 2013-01-04 NOTE — Progress Notes (Signed)
Reason for visit: Headache, tremor  Heather Simon is a 42 y.o. female  History of present illness:  Heather Simon is a 42 year old left-handed white female with a history of chronic daily headaches dating back at least 5 years. The patient indicates that the last day without headache was greater than 5 years ago. The headaches are primarily in the frontal regions, but may spread around to the back as well, occasion with sharp shooting pains in the left occipital area. The patient reports that the headaches are better in the morning, worse as the day goes on, associated with a squeezing pain. The patient reports some blurring of vision. The patient had numbness that may come and go on the arms and legs, occasionally affecting the tongue. The numbness issues are not present at all times. One and one half years ago, the patient began having tremors affecting the left arm and leg primarily. The patient has difficulty with handwriting because of this. The patient also reports fibromyalgia type symptoms with diffuse neuromuscular pain in the neck, shoulders, low back, hips. The patient feels "achy all over". The patient also reports severe fatigue, difficulty with insomnia, and difficulty with cognitive processing and memory. The patient is forgetting to pay bills, and has difficulty functioning day to day because of her cognitive issues. The patient denies any problems controlling the bowels or the bladder, but occasionally she has difficulty voiding the bladder completely. The patient has dizziness at times. The patient denies any significant balance issues. The patient does feel weak on the arms and legs. The patient reports occasional cramps in the feet and legs, primarily at night, but she will also have some cramps in the neck and shoulders during the day. The patient has been placed on low-dose nortriptyline, and she is on Cymbalta. The patient has not been able to tolerate higher doses of Cymbalta  greater than 30 mg twice daily. The patient is sent to this office for further evaluation. 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 There is an annular tear at the L3-4 level without neurocompression and a small central disc protrusion at the L4 levels. The patient last had an MRI of the brain in 2010 that was unremarkable. In the past, the patient has had blood work for a rheumatoid factor ANA, thyroid profile, and a vitamin B12 level that were unremarkable.  Past Medical History  Diagnosis Date  . Stroke   . Arthritis   . Seizures   . Hearing loss     left ear  . Chronic headaches   . Anxiety, mild   . Bulging disc     noncompressive  . Myalgia and myositis, unspecified 01/04/2013  . Headache(784.0) 01/04/2013  . Depression with anxiety 01/04/2013  . Insomnia 01/04/2013  . Tremor 01/04/2013  . Obesity     Past Surgical History  Procedure Laterality Date  . Tubal ligation    . Ankle surgery      Left, after surgery  . Fracture surgery      Family History  Problem Relation Age of Onset  . Cancer Mother     breast cancer  . Hypertension Mother   . COPD Father   . Heart disease Father   . Cancer Sister   . Cancer Maternal Grandmother   . Cancer Maternal Grandfather   . Neurofibromatosis Maternal Aunt     Social history:  reports that she has never smoked. She has never used smokeless tobacco. She reports that she does not drink alcohol or use illicit drugs.  Medications:  Current Outpatient Prescriptions on File Prior to Visit  Medication Sig Dispense Refill  . Multiple Vitamin (MULTIVITAMIN WITH MINERALS) TABS Take 1 tablet by mouth daily.       No current facility-administered medications on file prior to visit.     No Known Allergies  ROS:  Out of a complete 14 system review of symptoms, the patient complains only of the following symptoms, and all other  reviewed systems are negative.  Weight gain, fatigue Blurred vision Constipation Urination problems Easy bruising Joint pain, muscle cramps, achy muscles Memory loss, confusion, headache, numbness, weakness, dizziness, tremor Depression, insomnia, decreased energy, change in appetite, disinterest in activities  Blood pressure 138/89, pulse 115, height 5\' 7"  (1.702 m), weight 226 lb (102.513 kg).  Physical Exam  General: The patient is alert and cooperative at the time of the examination. The patient is moderately to markedly obese.  Head: Pupils are equal, round, and reactive to light. Discs are flat bilaterally.  Neck: The neck is supple, no carotid bruits are noted.  Respiratory: The respiratory examination is clear.  Cardiovascular: The cardiovascular examination reveals a regular rate and rhythm, no obvious murmurs or rubs are noted.  Neuromuscular: The patient has good range of movement of the cervical spine. The patient lacks about 10 or 15 of full flexion of the low back, otherwise full range of movement seen.  Skin: Extremities are without significant edema.  Neurologic Exam  Mental status: The patient is alert and oriented x 3 at the time of the examination.  Cranial nerves: Facial symmetry is present. There is good sensation of the face to pinprick and soft touch bilaterally. The strength of the facial muscles and the muscles to head turning and shoulder shrug are normal bilaterally. Speech is well enunciated, no aphasia or dysarthria is noted. Extraocular movements are full. Visual fields are full.  Motor: The motor testing reveals 5 over 5 strength of all 4 extremities. Good symmetric motor tone is noted throughout.  Sensory: Sensory testing is intact to pinprick, soft touch, vibration sensation, and position sense on all 4 extremities, with exception that pinprick and vibration sensation is slightly decreased on the left arm and leg as compared to the right. No  evidence of extinction is noted.  Coordination: Cerebellar testing reveals good finger-nose-finger and heel-to-shin bilaterally. A variable intention tremor seen with the left greater than right upper extremities. No tremor seen with the legs.  Gait and station: Gait is normal. Tandem gait is normal. Romberg is negative. No drift is seen.  Reflexes: Deep tendon reflexes are symmetric and normal bilaterally. Toes are downgoing bilaterally.   Assessment/Plan:  1. Fibromyalgia  2. Chronic fatigue  3. Anxiety and depression  4. Muscle tension headache  5. Tremor  6. Memory and concentration difficulties  7. Intermittent numbness  8. Reported dizziness  9. Insomnia  The patient has a multitude of different issues and complaints. The patient likely has underlying anxiety and depression, but she may have an overlying fibromyalgia/chronic fatigue syndrome issue as well. The patient has muscle tension headaches overlying these symptoms. The patient will be tapered off of the Cymbalta, and the nortriptyline will be increased to 50 mg at night. If this is well tolerated, the dose will continue to be increased. In the future, Lyrica can be added to the dosing regimen. The patient will  undergo MRI evaluation of the brain to exclude demyelinating disease, and to look for etiologies of the tremor. The tremor in part may be factitious. The patient followup in 4 months. The patient is to contact me if she is tolerating the medications and needs a dosing increase. Hopefully, Pamelor will improve the insomnia issues.  Marlan Palau MD 01/06/2013 10:27 AM  [facility] [address] [place], [place] [address]  Phone [phone] Fax [phone]

## 2013-01-04 NOTE — Patient Instructions (Signed)
Fibromyalgia Fibromyalgia is a disorder that is often misunderstood. It is associated with muscular pains and tenderness that comes and goes. It is often associated with fatigue and sleep disturbances. Though it tends to be long-lasting, fibromyalgia is not life-threatening. CAUSES  The exact cause of fibromyalgia is unknown. People with certain gene types are predisposed to developing fibromyalgia and other conditions. Certain factors can play a role as triggers, such as:  Spine disorders.  Arthritis.  Severe injury (trauma) and other physical stressors.  Emotional stressors. SYMPTOMS   The main symptom is pain and stiffness in the muscles and joints, which can vary over time.  Sleep and fatigue problems. Other related symptoms may include:  Bowel and bladder problems.  Headaches.  Visual problems.  Problems with odors and noises.  Depression or mood changes.  Painful periods (dysmenorrhea).  Dryness of the skin or eyes. DIAGNOSIS  There are no specific tests for diagnosing fibromyalgia. Patients can be diagnosed accurately from the specific symptoms they have. The diagnosis is made by determining that nothing else is causing the problems. TREATMENT  There is no cure. Management includes medicines and an active, healthy lifestyle. The goal is to enhance physical fitness, decrease pain, and improve sleep. HOME CARE INSTRUCTIONS   Only take over-the-counter or prescription medicines as directed by your caregiver. Sleeping pills, tranquilizers, and pain medicines may make your problems worse.  Low-impact aerobic exercise is very important and advised for treatment. At first, it may seem to make pain worse. Gradually increasing your tolerance will overcome this feeling.  Learning relaxation techniques and how to control stress will help you. Biofeedback, visual imagery, hypnosis, muscle relaxation, yoga, and meditation are all options.  Anti-inflammatory medicines and  physical therapy may provide short-term help.  Acupuncture or massage treatments may help.  Take muscle relaxant medicines as suggested by your caregiver.  Avoid stressful situations.  Plan a healthy lifestyle. This includes your diet, sleep, rest, exercise, and friends.  Find and practice a hobby you enjoy.  Join a fibromyalgia support group for interaction, ideas, and sharing advice. This may be helpful. SEEK MEDICAL CARE IF:  You are not having good results or improvement from your treatment. FOR MORE INFORMATION  National Fibromyalgia Association: www.fmaware.org Arthritis Foundation: www.arthritis.org Document Released: 01/11/2005 Document Revised: 04/05/2011 Document Reviewed: 04/23/2009 [facility] Patient Information 2014 [place], [place].    With the Cymbalta, take one a day for a week, then stop the medication.

## 2013-01-11 ENCOUNTER — Ambulatory Visit (INDEPENDENT_AMBULATORY_CARE_PROVIDER_SITE_OTHER): Payer: BC Managed Care – PPO

## 2013-01-11 DIAGNOSIS — IMO0001 Reserved for inherently not codable concepts without codable children: Secondary | ICD-10-CM

## 2013-01-11 DIAGNOSIS — G4700 Insomnia, unspecified: Secondary | ICD-10-CM

## 2013-01-11 DIAGNOSIS — F418 Other specified anxiety disorders: Secondary | ICD-10-CM

## 2013-01-11 DIAGNOSIS — R51 Headache: Secondary | ICD-10-CM

## 2013-01-11 DIAGNOSIS — F341 Dysthymic disorder: Secondary | ICD-10-CM

## 2013-01-11 DIAGNOSIS — R259 Unspecified abnormal involuntary movements: Secondary | ICD-10-CM

## 2013-01-11 DIAGNOSIS — R209 Unspecified disturbances of skin sensation: Secondary | ICD-10-CM

## 2013-01-11 DIAGNOSIS — R251 Tremor, unspecified: Secondary | ICD-10-CM

## 2013-01-12 ENCOUNTER — Telehealth: Payer: Self-pay | Admitting: Neurology

## 2013-01-12 NOTE — Telephone Encounter (Signed)
I called patient. 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 The patient will call our office if she is still having pain on the nortriptyline at 50 mg at night. The patient is off of Cymbalta. We will look for some improvement in the tremor.

## 2013-02-06 ENCOUNTER — Telehealth: Payer: Self-pay | Admitting: Internal Medicine

## 2013-02-06 MED ORDER — PREGABALIN 50 MG PO CAPS
ORAL_CAPSULE | ORAL | Status: DC
Start: 2013-02-06 — End: 2015-08-14

## 2013-02-06 NOTE — Telephone Encounter (Signed)
Patient states that she would like someone to call her back so that they can clarify about her medication and whether she needs to start a new one or if she needs to increase her dosage. Patient states her last phone call with Dr. Anne HahnWillis in December she did not really understand what he told her so she would like to speak with him again. Please call the patient and advise.

## 2013-02-06 NOTE — Telephone Encounter (Signed)
Called patient for name of medication she has questions about, lt VM message

## 2013-02-06 NOTE — Telephone Encounter (Signed)
I called patient. The patient is on nortriptyline at 50 mg at night. The patient did not tolerate Cymbalta. The patient has fibromyalgia, and we will start Lyrica at this point for this issue.

## 2013-05-07 ENCOUNTER — Ambulatory Visit: Payer: BC Managed Care – PPO | Admitting: Nurse Practitioner

## 2013-05-10 ENCOUNTER — Ambulatory Visit: Payer: BC Managed Care – PPO | Admitting: Nurse Practitioner

## 2013-05-18 ENCOUNTER — Other Ambulatory Visit: Payer: Self-pay

## 2013-05-18 MED ORDER — NORTRIPTYLINE HCL 25 MG PO CAPS: ORAL_CAPSULE | ORAL | Status: DC

## 2015-07-12 ENCOUNTER — Other Ambulatory Visit: Payer: Self-pay

## 2015-07-12 ENCOUNTER — Encounter (HOSPITAL_BASED_OUTPATIENT_CLINIC_OR_DEPARTMENT_OTHER): Payer: Self-pay | Admitting: *Deleted

## 2015-07-12 ENCOUNTER — Emergency Department (HOSPITAL_BASED_OUTPATIENT_CLINIC_OR_DEPARTMENT_OTHER): Payer: Self-pay

## 2015-07-12 ENCOUNTER — Emergency Department (HOSPITAL_BASED_OUTPATIENT_CLINIC_OR_DEPARTMENT_OTHER)
Admission: EM | Admit: 2015-07-12 | Discharge: 2015-07-13 | Disposition: A | Discharge status: Home / Self Care | Payer: Self-pay | Attending: Emergency Medicine | Admitting: Emergency Medicine

## 2015-07-12 DIAGNOSIS — Z79899 Other long term (current) drug therapy: Secondary | ICD-10-CM | POA: Insufficient documentation

## 2015-07-12 DIAGNOSIS — M1990 Unspecified osteoarthritis, unspecified site: Secondary | ICD-10-CM | POA: Insufficient documentation

## 2015-07-12 DIAGNOSIS — K219 Gastro-esophageal reflux disease without esophagitis: Secondary | ICD-10-CM | POA: Insufficient documentation

## 2015-07-12 DIAGNOSIS — E669 Obesity, unspecified: Secondary | ICD-10-CM | POA: Insufficient documentation

## 2015-07-12 DIAGNOSIS — F329 Major depressive disorder, single episode, unspecified: Secondary | ICD-10-CM | POA: Insufficient documentation

## 2015-07-12 DIAGNOSIS — Z6833 Body mass index (BMI) 33.0-33.9, adult: Secondary | ICD-10-CM | POA: Insufficient documentation

## 2015-07-12 LAB — CBC
HCT: 39.6 % (ref 36.0–46.0)
HEMOGLOBIN: 13 g/dL (ref 12.0–15.0)
MCH: 31.3 pg (ref 26.0–34.0)
MCHC: 32.8 g/dL (ref 30.0–36.0)
MCV: 95.2 fL (ref 78.0–100.0)
PLATELETS: 214 10*3/uL (ref 150–400)
RBC: 4.16 MIL/uL (ref 3.87–5.11)
RDW: 12.8 % (ref 11.5–15.5)
WBC: 6 10*3/uL (ref 4.0–10.5)

## 2015-07-12 LAB — D-DIMER, QUANTITATIVE: D-Dimer, Quant: 0.27 ug/mL-FEU (ref 0.00–0.50)

## 2015-07-12 LAB — TROPONIN I: Troponin I: <0.03 ng/mL (ref <0.031)

## 2015-07-12 LAB — BASIC METABOLIC PANEL
Anion gap: 6 (ref 5–15)
BUN: 17 mg/dL (ref 6–20)
CHLORIDE: 108 mmol/L (ref 101–111)
CO2: 25 mmol/L (ref 22–32)
CREATININE: 0.91 mg/dL (ref 0.44–1.00)
Calcium: 9.4 mg/dL (ref 8.9–10.3)
GFR calc non Af Amer: >60 mL/min (ref >60)
Glucose, Bld: 75 mg/dL (ref 65–99)
Potassium: 3.7 mmol/L (ref 3.5–5.1)
Sodium: 139 mmol/L (ref 135–145)

## 2015-07-12 MED ORDER — GI COCKTAIL ~~LOC~~
30.0000 mL | Freq: Once | ORAL | Status: AC
  Administered 2015-07-12: 30 mL via ORAL
  Filled 2015-07-12: qty 30

## 2015-07-12 NOTE — ED Provider Notes (Signed)
CSN: [id number]     Arrival date & time 07/12/15  2300 History  By signing my name below, I, Heather Simon, attest that this documentation has been prepared under the direction and in the presence of Ramiya Delahunty, MD. Electronically Signed: Bethel Simon, ED Scribe. 07/13/2015. 12:04 AM   Chief Complaint  Patient presents with  . Chest Pain    Patient is a 45 y.o. female presenting with chest pain. The history is provided by the patient. No language interpreter was used.  Chest Pain Pain location:  Substernal area Pain quality: tightness   Pain radiates to:  Does not radiate Pain radiates to the back: no   Pain severity:  Severe Onset quality:  Gradual Duration:  24 hours Timing:  Constant Progression:  Waxing and waning Chronicity:  Recurrent Context: eating and at rest   Context: no trauma   Relieved by:  Nothing Worsened by:  Nothing tried Ineffective treatments:  None tried Associated symptoms: no cough, no palpitations, not vomiting and no weakness   Risk factors: no birth control, no immobilization, not female and no surgery    Heather Simon is a 45 y.o. female with PMHx of stroke, seizures, and obesity who presents to the Emergency Department complaining of constant, waxing and waning, tight, central chest pain with onset approximately 24 hours ago while at rest. She has no exertional component and does not get pain even when going up stairs or walking long distances. The pain is worse at night lying flat. She has similar pain 1 month ago but was not evaluated at that time.  No recent long travel by car or plane, surgery or hospitalization, bone fracture, or hormonal therapy. No new diet, OTC supplementation, exercise regimens, trauma, heavy lifting, or falls. PCP is Dr. Dareen Piano.   Past Medical History  Diagnosis Date  . Stroke (HCC)   . Arthritis   . Seizures (HCC)   . Hearing loss     left ear  . Chronic headaches   . Anxiety, mild   . Bulging disc      noncompressive  . Myalgia and myositis, unspecified 01/04/2013  . Headache(784.0) 01/04/2013  . Depression with anxiety 01/04/2013  . Insomnia 01/04/2013  . Tremor 01/04/2013  . Obesity    Past Surgical History  Procedure Laterality Date  . Tubal ligation    . Ankle surgery      Left, after surgery  . Fracture surgery     Family History  Problem Relation Age of Onset  . Cancer Mother     breast cancer  . Hypertension Mother   . COPD Father   . Heart disease Father   . Cancer Sister   . Cancer Maternal Grandmother   . Cancer Maternal Grandfather   . Neurofibromatosis Maternal Aunt    Social History  Substance Use Topics  . Smoking status: Never Smoker   . Smokeless tobacco: Never Used  . Alcohol Use: No   OB History    No data available     Review of Systems  Respiratory: Negative for cough.   Cardiovascular: Positive for chest pain. Negative for palpitations and leg swelling.  Gastrointestinal: Negative for vomiting.  Neurological: Negative for weakness.  All other systems reviewed and are negative.   Allergies  Review of patient's allergies indicates no known allergies.  Home Medications   Prior to Admission medications   Medication Sig Start Date End Date Taking? Authorizing Provider  magnesium gluconate (MAGONATE) 500 MG tablet Take 500 mg  by mouth daily.   Yes Historical Provider, MD  Suvorexant (BELSOMRA) 10 MG TABS Take 10 mg by mouth.   Yes Historical Provider, MD  topiramate (TOPAMAX) 50 MG tablet Take 50 mg by mouth daily.   Yes Historical Provider, MD  carisoprodol (SOMA) 250 MG tablet 250 mg daily. 12/26/12   Historical Provider, MD  cholecalciferol (VITAMIN D) 1000 UNITS tablet Take 1,000 Units by mouth daily.    Historical Provider, MD  clonazePAM (KLONOPIN) 0.5 MG tablet Take 0.5 mg by mouth at bedtime. 12/26/12   Historical Provider, MD  cyanocobalamin 500 MCG tablet Take 500 mcg by mouth daily.    Historical Provider, MD  DULoxetine (CYMBALTA)  30 MG capsule 30 mg 2 (two) times daily. 11/16/12   Historical Provider, MD  HYDROcodone-acetaminophen (NORCO) 7.5-325 MG per tablet Take 1 tablet by mouth every 6 (six) hours as needed.  12/26/12   Historical Provider, MD  Multiple Vitamin (MULTIVITAMIN WITH MINERALS) TABS Take 1 tablet by mouth daily.    Historical Provider, MD  nortriptyline (PAMELOR) 25 MG capsule One capsule at night for one week, then take 2 capsules at night Patient taking differently: 75 mg at bedtime. One capsule at night for one week, then take 2 capsules at night 05/18/13   York Spaniel, MD  pregabalin (LYRICA) 50 MG capsule 1 capsule at night for one week, then take one capsule twice daily for one week, then take one capsule in the morning, and 2 capsules in the evening 02/06/13   York Spaniel, MD   BP 132/92 mmHg  Pulse 104  Temp(Src) 99 F (37.2 C) (Oral)  Resp 18  Ht 5\' 7"  (1.702 m)  Wt 215 lb (97.523 kg)  BMI 33.67 kg/m2  SpO2 100%  LMP 06/28/2015 Physical Exam  Constitutional: She is oriented to person, place, and time. She appears well-developed and well-nourished.  HENT:  Head: Normocephalic.  Moist mucous membranes No exudate  Eyes: Pupils are equal, round, and reactive to light.  Neck: Normal range of motion.  Trachea midline No bruit   Cardiovascular: Normal rate and regular rhythm.   Pulses:      Dorsalis pedis pulses are 3+ on the right side, and 3+ on the left side.  Pulmonary/Chest: Effort normal and breath sounds normal. No stridor.  CTAB   Abdominal: Soft. She exhibits no mass. There is no tenderness. There is no rebound and no guarding.  Gas noted up into the thoracic cavity  Musculoskeletal: Normal range of motion.  Neurological: She is alert and oriented to person, place, and time.  Skin: Skin is warm and dry.  Psychiatric: She has a normal mood and affect. Her behavior is normal.  Nursing note and vitals reviewed.   ED Course  Procedures (including critical care  time) DIAGNOSTIC STUDIES: Oxygen Saturation is 100% on RA,  normal by my interpretation.    COORDINATION OF CARE: 11:34 PM Discussed treatment plan which includes lab work, CXR, EKG with pt at bedside and pt agreed to plan. Labs Review Labs Reviewed  BASIC METABOLIC PANEL  CBC  TROPONIN I  D-DIMER, QUANTITATIVE (NOT AT [facility])    Imaging Review Dg Chest 2 View  07/12/2015  CLINICAL DATA:  Initial evaluation for acute central chest pain. EXAM: CHEST  2 VIEW COMPARISON:  Prior CT from 06/01/2012. FINDINGS: The cardiac and mediastinal silhouettes are stable in size and contour, and remain within normal limits. The lungs are normally inflated. No airspace consolidation, pleural effusion, or pulmonary edema  is identified. There is no pneumothorax. No acute osseous abnormality identified. IMPRESSION: No active cardiopulmonary disease. Electronically Signed   By: Rise Mu M.D.   On: 07/12/2015 23:35   I have personally reviewed and evaluated these images and lab results as part of my medical decision-making.   EKG Interpretation None      MDM   Final diagnoses:  None    Filed Vitals:   07/12/15 2312 07/13/15 0030  BP: 132/92 113/75  Pulse: 104 97  Temp: 99 F (37.2 C)   Resp: 18 15     Date: 07/13/2015  Rate: 100  Rhythm: sinus tachycardia  QRS Axis: normal  Intervals: normal  ST/T Wave abnormalities: normal  Conduction Disutrbances: none  Narrative Interpretation: sinus tachycardia  Results for orders placed or performed during the hospital encounter of 07/12/15  Basic metabolic panel  Result Value Ref Range   Sodium 139 135 - 145 mmol/L   Potassium 3.7 3.5 - 5.1 mmol/L   Chloride 108 101 - 111 mmol/L   CO2 25 22 - 32 mmol/L   Glucose, Bld 75 65 - 99 mg/dL   BUN 17 6 - 20 mg/dL   Creatinine, Ser 1.61 0.44 - 1.00 mg/dL   Calcium 9.4 8.9 - 09.6 mg/dL   GFR calc non Af Amer >60 >60 mL/min   GFR calc Af Amer >60 >60 mL/min   Anion gap 6 5 - 15  CBC   Result Value Ref Range   WBC 6.0 4.0 - 10.5 K/uL   RBC 4.16 3.87 - 5.11 MIL/uL   Hemoglobin 13.0 12.0 - 15.0 g/dL   HCT 04.5 40.9 - 81.1 %   MCV 95.2 78.0 - 100.0 fL   MCH 31.3 26.0 - 34.0 pg   MCHC 32.8 30.0 - 36.0 g/dL   RDW 91.4 78.2 - 95.6 %   Platelets 214 150 - 400 K/uL  Troponin I  Result Value Ref Range   Troponin I <0.03 <0.031 ng/mL  D-dimer, quantitative (not at [facility])  Result Value Ref Range   D-Dimer, Quant 0.27 0.00 - 0.50 ug/mL-FEU   Dg Chest 2 View  07/12/2015  CLINICAL DATA:  Initial evaluation for acute central chest pain. EXAM: CHEST  2 VIEW COMPARISON:  Prior CT from 06/01/2012. FINDINGS: The cardiac and mediastinal silhouettes are stable in size and contour, and remain within normal limits. The lungs are normally inflated. No airspace consolidation, pleural effusion, or pulmonary edema is identified. There is no pneumothorax. No acute osseous abnormality identified. IMPRESSION: No active cardiopulmonary disease. Electronically Signed   By: Rise Mu M.D.   On: 07/12/2015 23:35    Medications  gi cocktail (Maalox,Lidocaine,Donnatal) (30 mLs Oral Given 07/12/15 2342)  ketorolac (TORADOL) 30 MG/ML injection 30 mg (30 mg Intravenous Given 07/13/15 0007)    Ruled out for MI in the ED with normal ekg and troponin and 24 hours of constant symptoms.  Ddimer is negative in a very low risk patient with excludes PE.  Wells 0.  Symptoms are consistent with GERD as patient has been eating a lot of fast food and symptoms are worst lying flat at night.  HEART score is 1.  Patient is stable for discharge with close follow up with her PMD.  Strict return precautions given.  Eat a bland diet and start pepcid BID.    I personally performed the services described in this documentation, which was scribed in my presence. The recorded information has been reviewed and is accurate.  Cy BlamerApril Ellamae Lybeck, MD 07/13/15 [id number]

## 2015-07-12 NOTE — ED Notes (Signed)
Pt describes pressure to central chest onset last p.m. Similar episode a month or so ago, but did not get it checked out. +SHOB. Pain increased with deep inspiration.

## 2015-07-13 ENCOUNTER — Encounter (HOSPITAL_BASED_OUTPATIENT_CLINIC_OR_DEPARTMENT_OTHER): Payer: Self-pay | Admitting: Emergency Medicine

## 2015-07-13 MED ORDER — KETOROLAC TROMETHAMINE 30 MG/ML IJ SOLN
30.0000 mg | Freq: Once | INTRAMUSCULAR | Status: AC
  Administered 2015-07-13: 30 mg via INTRAVENOUS
  Filled 2015-07-13: qty 1

## 2015-07-13 MED ORDER — FAMOTIDINE 20 MG PO TABS: 20.0000 mg | ORAL_TABLET | Freq: Two times a day (BID) | ORAL | Status: DC

## 2015-08-11 ENCOUNTER — Emergency Department (HOSPITAL_COMMUNITY): Payer: Self-pay

## 2015-08-11 ENCOUNTER — Inpatient Hospital Stay (HOSPITAL_COMMUNITY)
Admission: EM | Admit: 2015-08-11 | Discharge: 2015-08-14 | DRG: 871 | Disposition: A | Discharge status: Home / Self Care | Payer: Self-pay | Attending: Family Medicine | Admitting: Family Medicine

## 2015-08-11 ENCOUNTER — Encounter (HOSPITAL_COMMUNITY): Payer: Self-pay | Admitting: Emergency Medicine

## 2015-08-11 DIAGNOSIS — Z79899 Other long term (current) drug therapy: Secondary | ICD-10-CM

## 2015-08-11 DIAGNOSIS — Z9851 Tubal ligation status: Secondary | ICD-10-CM

## 2015-08-11 DIAGNOSIS — Z803 Family history of malignant neoplasm of breast: Secondary | ICD-10-CM

## 2015-08-11 DIAGNOSIS — R51 Headache: Secondary | ICD-10-CM | POA: Diagnosis present

## 2015-08-11 DIAGNOSIS — Z8673 Personal history of transient ischemic attack (TIA), and cerebral infarction without residual deficits: Secondary | ICD-10-CM

## 2015-08-11 DIAGNOSIS — Z8249 Family history of ischemic heart disease and other diseases of the circulatory system: Secondary | ICD-10-CM

## 2015-08-11 DIAGNOSIS — I445 Left posterior fascicular block: Secondary | ICD-10-CM | POA: Diagnosis present

## 2015-08-11 DIAGNOSIS — J189 Pneumonia, unspecified organism: Secondary | ICD-10-CM

## 2015-08-11 DIAGNOSIS — F418 Other specified anxiety disorders: Secondary | ICD-10-CM

## 2015-08-11 DIAGNOSIS — M797 Fibromyalgia: Secondary | ICD-10-CM | POA: Diagnosis present

## 2015-08-11 DIAGNOSIS — R000 Tachycardia, unspecified: Secondary | ICD-10-CM | POA: Diagnosis present

## 2015-08-11 DIAGNOSIS — M609 Myositis, unspecified: Secondary | ICD-10-CM | POA: Diagnosis present

## 2015-08-11 DIAGNOSIS — Z888 Allergy status to other drugs, medicaments and biological substances status: Secondary | ICD-10-CM

## 2015-08-11 DIAGNOSIS — E669 Obesity, unspecified: Secondary | ICD-10-CM | POA: Diagnosis present

## 2015-08-11 DIAGNOSIS — H9190 Unspecified hearing loss, unspecified ear: Secondary | ICD-10-CM | POA: Diagnosis present

## 2015-08-11 DIAGNOSIS — I4581 Long QT syndrome: Secondary | ICD-10-CM

## 2015-08-11 DIAGNOSIS — F419 Anxiety disorder, unspecified: Secondary | ICD-10-CM | POA: Diagnosis present

## 2015-08-11 DIAGNOSIS — A419 Sepsis, unspecified organism: Principal | ICD-10-CM | POA: Diagnosis present

## 2015-08-11 DIAGNOSIS — Z825 Family history of asthma and other chronic lower respiratory diseases: Secondary | ICD-10-CM

## 2015-08-11 DIAGNOSIS — Z6837 Body mass index (BMI) 37.0-37.9, adult: Secondary | ICD-10-CM

## 2015-08-11 DIAGNOSIS — R9431 Abnormal electrocardiogram [ECG] [EKG]: Secondary | ICD-10-CM | POA: Diagnosis present

## 2015-08-11 DIAGNOSIS — H9192 Unspecified hearing loss, left ear: Secondary | ICD-10-CM | POA: Diagnosis present

## 2015-08-11 DIAGNOSIS — R251 Tremor, unspecified: Secondary | ICD-10-CM | POA: Diagnosis present

## 2015-08-11 DIAGNOSIS — J181 Lobar pneumonia, unspecified organism: Secondary | ICD-10-CM | POA: Diagnosis present

## 2015-08-11 DIAGNOSIS — F329 Major depressive disorder, single episode, unspecified: Secondary | ICD-10-CM | POA: Diagnosis present

## 2015-08-11 LAB — MAGNESIUM: MAGNESIUM: 1.6 mg/dL — AB (ref 1.7–2.4)

## 2015-08-11 LAB — BASIC METABOLIC PANEL
Anion gap: 7 (ref 5–15)
BUN: 16 mg/dL (ref 6–20)
CHLORIDE: 105 mmol/L (ref 101–111)
CO2: 22 mmol/L (ref 22–32)
CREATININE: 0.74 mg/dL (ref 0.44–1.00)
Calcium: 9.6 mg/dL (ref 8.9–10.3)
GFR calc non Af Amer: >60 mL/min (ref >60)
Glucose, Bld: 108 mg/dL — ABNORMAL HIGH (ref 65–99)
Potassium: 3.9 mmol/L (ref 3.5–5.1)
Sodium: 134 mmol/L — ABNORMAL LOW (ref 135–145)

## 2015-08-11 LAB — URINALYSIS, ROUTINE W REFLEX MICROSCOPIC
Bilirubin Urine: NEGATIVE
Glucose, UA: NEGATIVE mg/dL
HGB URINE DIPSTICK: NEGATIVE
Ketones, ur: NEGATIVE mg/dL
LEUKOCYTES UA: NEGATIVE
Nitrite: NEGATIVE
Protein, ur: NEGATIVE mg/dL
SPECIFIC GRAVITY, URINE: 1.01 (ref 1.005–1.030)
pH: 6.5 (ref 5.0–8.0)

## 2015-08-11 LAB — PROCALCITONIN: Procalcitonin: 2.58 ng/mL

## 2015-08-11 LAB — LACTIC ACID, PLASMA
LACTIC ACID, VENOUS: 1.7 mmol/L (ref 0.5–1.9)
LACTIC ACID, VENOUS: 1.7 mmol/L (ref 0.5–1.9)

## 2015-08-11 LAB — APTT: aPTT: 26 seconds (ref 24–37)

## 2015-08-11 LAB — PROTIME-INR
INR: 1.02 (ref 0.00–1.49)
Prothrombin Time: 13.2 seconds (ref 11.6–15.2)

## 2015-08-11 LAB — CBC
HCT: 40.8 % (ref 36.0–46.0)
Hemoglobin: 13.7 g/dL (ref 12.0–15.0)
MCH: 31.6 pg (ref 26.0–34.0)
MCHC: 33.6 g/dL (ref 30.0–36.0)
MCV: 94 fL (ref 78.0–100.0)
PLATELETS: 162 10*3/uL (ref 150–400)
RBC: 4.34 MIL/uL (ref 3.87–5.11)
RDW: 12.5 % (ref 11.5–15.5)
WBC: 9 10*3/uL (ref 4.0–10.5)

## 2015-08-11 LAB — I-STAT CG4 LACTIC ACID, ED: Lactic Acid, Venous: 1.91 mmol/L (ref 0.5–1.9)

## 2015-08-11 LAB — I-STAT TROPONIN, ED: Troponin i, poc: 0 ng/mL (ref 0.00–0.08)

## 2015-08-11 MED ORDER — HYDROCODONE-ACETAMINOPHEN 7.5-325 MG PO TABS
2.0000 | ORAL_TABLET | Freq: Four times a day (QID) | ORAL | Status: DC | PRN
  Administered 2015-08-11 – 2015-08-14 (×10): 2 via ORAL
  Filled 2015-08-11 (×10): qty 2

## 2015-08-11 MED ORDER — MAGNESIUM GLUCONATE 500 MG PO TABS
500.0000 mg | ORAL_TABLET | Freq: Every day | ORAL | Status: DC
  Administered 2015-08-11 – 2015-08-14 (×4): 500 mg via ORAL
  Filled 2015-08-11 (×4): qty 1

## 2015-08-11 MED ORDER — VITAMIN B-12 1000 MCG PO TABS
500.0000 ug | ORAL_TABLET | Freq: Every day | ORAL | Status: DC
  Administered 2015-08-11 – 2015-08-14 (×4): 500 ug via ORAL
  Filled 2015-08-11 (×4): qty 1

## 2015-08-11 MED ORDER — DEXTROSE 5 % IV SOLN
1.0000 g | Freq: Once | INTRAVENOUS | Status: AC
  Administered 2015-08-11: 1 g via INTRAVENOUS
  Filled 2015-08-11: qty 10

## 2015-08-11 MED ORDER — MAGNESIUM SULFATE 4 GM/100ML IV SOLN
4.0000 g | Freq: Once | INTRAVENOUS | Status: AC
  Administered 2015-08-11: 4 g via INTRAVENOUS
  Filled 2015-08-11: qty 100

## 2015-08-11 MED ORDER — DEXTROSE 5 % IV SOLN: 500.0000 mg | INTRAVENOUS | Status: DC

## 2015-08-11 MED ORDER — SODIUM CHLORIDE 0.9 % IV BOLUS (SEPSIS)
1000.0000 mL | Freq: Once | INTRAVENOUS | Status: AC
  Administered 2015-08-11: 1000 mL via INTRAVENOUS

## 2015-08-11 MED ORDER — TOPIRAMATE 25 MG PO TABS
50.0000 mg | ORAL_TABLET | Freq: Every day | ORAL | Status: DC
Start: 2015-08-11 — End: 2015-08-14
  Administered 2015-08-11 – 2015-08-13 (×3): 50 mg via ORAL
  Filled 2015-08-11 (×3): qty 2

## 2015-08-11 MED ORDER — SODIUM CHLORIDE 0.9 % IV SOLN
INTRAVENOUS | Status: DC
  Administered 2015-08-11 – 2015-08-13 (×4): via INTRAVENOUS

## 2015-08-11 MED ORDER — SUVOREXANT 10 MG PO TABS
10.0000 mg | ORAL_TABLET | Freq: Every evening | ORAL | Status: DC | PRN
  Administered 2015-08-12: 10 mg via ORAL
  Filled 2015-08-11 (×2): qty 1

## 2015-08-11 MED ORDER — FENTANYL CITRATE (PF) 100 MCG/2ML IJ SOLN
50.0000 ug | Freq: Once | INTRAMUSCULAR | Status: AC
  Administered 2015-08-11: 50 ug via INTRAVENOUS
  Filled 2015-08-11: qty 2

## 2015-08-11 MED ORDER — SODIUM CHLORIDE 0.9 % IV BOLUS (SEPSIS): 1000.0000 mL | INTRAVENOUS | Status: DC | PRN

## 2015-08-11 MED ORDER — ACETAMINOPHEN 325 MG PO TABS
650.0000 mg | ORAL_TABLET | Freq: Once | ORAL | Status: AC | PRN
  Administered 2015-08-11: 650 mg via ORAL
  Filled 2015-08-11: qty 2

## 2015-08-11 MED ORDER — CYCLOBENZAPRINE HCL 10 MG PO TABS: 200.0000 mg | ORAL_TABLET | Freq: Every day | ORAL | Status: DC

## 2015-08-11 MED ORDER — DEXTROSE 5 % IV SOLN
500.0000 mg | Freq: Once | INTRAVENOUS | Status: DC
  Filled 2015-08-11: qty 500

## 2015-08-11 MED ORDER — FAMOTIDINE 20 MG PO TABS: 20.0000 mg | ORAL_TABLET | Freq: Two times a day (BID) | ORAL | Status: DC | PRN

## 2015-08-11 MED ORDER — ENOXAPARIN SODIUM 40 MG/0.4ML ~~LOC~~ SOLN
40.0000 mg | SUBCUTANEOUS | Status: DC
  Administered 2015-08-11 – 2015-08-13 (×3): 40 mg via SUBCUTANEOUS
  Filled 2015-08-11 (×3): qty 0.4

## 2015-08-11 MED ORDER — LEVOFLOXACIN IN D5W 750 MG/150ML IV SOLN: 750.0000 mg | INTRAVENOUS | Status: DC

## 2015-08-11 MED ORDER — GUAIFENESIN ER 600 MG PO TB12
1200.0000 mg | ORAL_TABLET | Freq: Two times a day (BID) | ORAL | Status: DC
  Administered 2015-08-11 – 2015-08-14 (×6): 1200 mg via ORAL
  Filled 2015-08-11 (×6): qty 2

## 2015-08-11 MED ORDER — PIPERACILLIN-TAZOBACTAM 3.375 G IVPB
3.3750 g | Freq: Three times a day (TID) | INTRAVENOUS | Status: DC
  Administered 2015-08-11 – 2015-08-14 (×9): 3.375 g via INTRAVENOUS
  Filled 2015-08-11 (×8): qty 50

## 2015-08-11 MED ORDER — IOPAMIDOL (ISOVUE-370) INJECTION 76%
100.0000 mL | Freq: Once | INTRAVENOUS | Status: AC | PRN
  Administered 2015-08-11: 100 mL via INTRAVENOUS

## 2015-08-11 MED ORDER — DEXTROSE 5 % IV SOLN: 1.0000 g | INTRAVENOUS | Status: DC

## 2015-08-11 MED ORDER — KETOROLAC TROMETHAMINE 30 MG/ML IJ SOLN
30.0000 mg | Freq: Four times a day (QID) | INTRAMUSCULAR | Status: DC | PRN
  Administered 2015-08-11 – 2015-08-12 (×2): 30 mg via INTRAVENOUS
  Filled 2015-08-11 (×2): qty 1

## 2015-08-11 MED ORDER — CYCLOBENZAPRINE HCL 10 MG PO TABS
20.0000 mg | ORAL_TABLET | Freq: Every day | ORAL | Status: DC
  Administered 2015-08-11 – 2015-08-13 (×3): 20 mg via ORAL
  Filled 2015-08-11 (×3): qty 2

## 2015-08-11 MED ORDER — VITAMIN D3 25 MCG (1000 UNIT) PO TABS
1000.0000 [IU] | ORAL_TABLET | Freq: Every day | ORAL | Status: DC
  Administered 2015-08-11 – 2015-08-14 (×4): 1000 [IU] via ORAL
  Filled 2015-08-11 (×4): qty 1

## 2015-08-11 MED ORDER — ADULT MULTIVITAMIN W/MINERALS CH
1.0000 | ORAL_TABLET | Freq: Every day | ORAL | Status: DC
  Administered 2015-08-11 – 2015-08-14 (×4): 1 via ORAL
  Filled 2015-08-11 (×4): qty 1

## 2015-08-11 MED ORDER — POTASSIUM CHLORIDE CRYS ER 20 MEQ PO TBCR
40.0000 meq | EXTENDED_RELEASE_TABLET | Freq: Once | ORAL | Status: AC
  Administered 2015-08-11: 40 meq via ORAL
  Filled 2015-08-11: qty 2

## 2015-08-11 MED ORDER — ALUM & MAG HYDROXIDE-SIMETH 200-200-20 MG/5ML PO SUSP
30.0000 mL | Freq: Four times a day (QID) | ORAL | Status: DC | PRN
  Administered 2015-08-11: 30 mL via ORAL
  Filled 2015-08-11 (×2): qty 30

## 2015-08-11 NOTE — Progress Notes (Signed)
Pharmacy Antibiotic Note  Heather Simon is a 45 y.o. female admitted on 08/11/2015 with sepsis 2/2 multilobar pneumonia.  MD would like to avoid macrolides and quinolones due to prolonged QT. Suggested Ceftriaxone + Doxycycline for CAP.  Pharmacy has been consulted for Zosyn dosing for now.  Plan: Zosyn 3.375g IV q8h (4 hour infusion time).   Height: 5\' 7"  (170.2 cm) Weight: 240 lb 15.4 oz (109.3 kg) IBW/kg (Calculated) : 61.6  Temp (24hrs), Avg:100.7 F (38.2 C), Min:99.3 F (37.4 C), Max:102.9 F (39.4 C)   Recent Labs Lab 08/11/15 1226 08/11/15 1246  WBC 9.0  --   CREATININE 0.74  --   LATICACIDVEN  --  1.91*    Estimated Creatinine Clearance: 114.3 mL/min (by C-G formula based on Cr of 0.74).    Allergies  Allergen Reactions  . Lyrica [Pregabalin] Other (See Comments)    Altered mental state/ weight gain     Antimicrobials this admission: 7/17 Ceftriaxone x 1 7/17 Zosyn >>   Dose adjustments this admission: -  Microbiology results: 7/17 BCx: sent 7/17 Sputum: sent  7/17 HIV ab: ordered 7/17 strep pneumo ur ag: ordered 7/17 legionella ur ag: ordered  Thank you for allowing pharmacy to be a part of this patient's care.  Clance BollRunyon, Titus Drone 08/11/2015 4:39 PM

## 2015-08-11 NOTE — ED Notes (Signed)
Pt reports cp with SOB which started early this am around 0300.  Pt states pain is worse when taking a deep breath with dizziness.  Pt presents with a fever but denies any cough at this time.

## 2015-08-11 NOTE — ED Notes (Signed)
Patient transported to CT 

## 2015-08-11 NOTE — H&P (Addendum)
TRH H&P   Patient Demographics:    Heather Simon, is a 45 y.o. female  MRN: [id number]   DOB - 07-31-1970  Admit Date - 08/11/2015  Outpatient Primary MD for the patient is Diamantina Providence, FNP  Referring MD: Dr Rubin Payor  Outpatient Specialists: none  Patient coming from: home  Chief Complaint  Patient presents with  . Chest Pain  . Shortness of Breath  . Fever      HPI:    Heather Simon  is a 45 y.o. female, With history of stroke in 2010 with hx of Seizures, chronic headache (follows with Dr. Anne Hahn), anxiety and depression, fibromyalgia, chronic fatigue who presented to the ED with acute onset of shortness of breath with substernal chest pain since last night. Patient reports coughing up some whitish phlegm. Chest pain was aggravated with coughing and deep breath and nonradiating.Patient denied any fever or chills, orthopnea or PND. Denies any sick contacts, recent travel, recent illness or change in her medications. She denies any nausea, vomiting, abdominal pain, dysuria, diarrhea, worsening headache, blurred vision, tingling or numbness of her extremities or any weakness.  Course in the ED Patient was septic with fever of 102.32F, tachycardic up to 160, normal respiratory rate, and normal O2 sat on room air. Blood work showed normal CBC and chemistry. Lactic acid was 1.91. Chest x-ray showed patchy opacities in bilateral lung base. Given her persistent tachycardia a CT angiogram of the chest was done which was negative for PE but showed multifocal pneumonia involving bilateral lower lung segments and posterior segment of right upper lobe. No effusion. Patient ordered for IV Rocephin and azithromycin (azithromycin discontinued due to prolonged QTC). Given 2 L IV normal saline bolus and heart rate improved to 110. She was given IV fentanyl for chest pain symptoms and  hospitalist admission requested to telemetry.   During my evaluation her chest pain had slightly improved, dyspnea was better and heart rate was in 130s, satting in the 90s on room air.    Review of systems:    In addition to the HPI above,  No Fever-chills, No Headache, No changes with Vision or hearing, No problems swallowing food or Liquids,  Chest pain, Cough or Shortness of Breath, No Abdominal pain, No Nausea or Vommitting, Bowel movements are regular, No Blood in stool or Urine, No dysuria, No new skin rashes or bruises, No new joints pains-aches,  No new weakness, tingling, numbness in any extremity, No recent weight gain or loss, No polyuria, polydypsia or polyphagia, No significant Mental Stressors.  A full 10 point Review of Systems was done, except as stated above, all other Review of Systems were negative.   With Past History of the following :    Past Medical History  Diagnosis Date  . Stroke (HCC)   . Arthritis   . Seizures (HCC)   . Hearing  loss     left ear  . Chronic headaches   . Anxiety, mild   . Bulging disc     noncompressive  . Myalgia and myositis, unspecified 01/04/2013  . Headache(784.0) 01/04/2013  . Depression with anxiety 01/04/2013  . Insomnia 01/04/2013  . Tremor 01/04/2013  . Obesity       Past Surgical History  Procedure Laterality Date  . Tubal ligation    . Ankle surgery      Left, after surgery  . Fracture surgery        Social History:     Social History  Substance Use Topics  . Smoking status: Never Smoker   . Smokeless tobacco: Never Used  . Alcohol Use: No     Lives - Home with her husband  Mobility - ambulatory     Family History :     Family History  Problem Relation Age of Onset  . Cancer Mother     breast cancer  . Hypertension Mother   . COPD Father   . Heart disease Father   . Cancer Sister   . Cancer Maternal Grandmother   . Cancer Maternal Grandfather   . Neurofibromatosis Maternal  Aunt       Home Medications:   Prior to Admission medications   Medication Sig Start Date End Date Taking? Authorizing Provider  cholecalciferol (VITAMIN D) 1000 UNITS tablet Take 1,000 Units by mouth daily.   Yes Historical Provider, MD  cyanocobalamin 500 MCG tablet Take 500 mcg by mouth daily.   Yes Historical Provider, MD  cyclobenzaprine (FLEXERIL) 10 MG tablet Take 20 tablets by mouth at bedtime. 08/04/15  Yes Historical Provider, MD  famotidine (PEPCID) 20 MG tablet Take 1 tablet (20 mg total) by mouth 2 (two) times daily. Patient taking differently: Take 20 mg by mouth 2 (two) times daily as needed for heartburn or indigestion.  07/13/15  Yes April Palumbo, MD  HYDROcodone-acetaminophen (NORCO) 7.5-325 MG per tablet Take 1 tablet by mouth 2 (two) times daily as needed for moderate pain.  12/26/12  Yes Historical Provider, MD  magnesium gluconate (MAGONATE) 500 MG tablet Take 500 mg by mouth daily.   Yes Historical Provider, MD  Multiple Vitamin (MULTIVITAMIN WITH MINERALS) TABS Take 1 tablet by mouth daily.   Yes Historical Provider, MD  nortriptyline (PAMELOR) 75 MG capsule Take 75 mg by mouth at bedtime. 08/04/15  Yes Historical Provider, MD  Suvorexant (BELSOMRA) 10 MG TABS Take 10 mg by mouth at bedtime as needed (sleep).    Yes Historical Provider, MD  topiramate (TOPAMAX) 25 MG tablet Take 50 mg by mouth at bedtime. 08/04/15  Yes Historical Provider, MD  pregabalin (LYRICA) 50 MG capsule 1 capsule at night for one week, then take one capsule twice daily for one week, then take one capsule in the morning, and 2 capsules in the evening Patient not taking: Reported on 08/11/2015 02/06/13   York Spaniel, MD     Allergies:     Allergies  Allergen Reactions  . Lyrica [Pregabalin] Other (See Comments)    Altered mental state/ weight gain      Physical Exam:   Vitals  Blood pressure 114/73, pulse 128, temperature 100 F (37.8 C), temperature source Oral, resp. rate 13, last  menstrual period 07/28/2015, SpO2 99 %.   Middle aged female lying in bed appears fatigued HEENT: No pallor, moist mucosa, supple neck, no cervical lymphadenopathy Chest: Diminished bilateral breath sounds with scattered rhonchi CVS: S1 and  S2 tachycardic, no murmurs rub or gallop GI: Soft, nondistended, nontender, bowel sounds present Musculoskeletal: Warm, no edema CNS: Alert and oriented     Data Review:    CBC  Recent Labs Lab 08/11/15 1226  WBC 9.0  HGB 13.7  HCT 40.8  PLT 162  MCV 94.0  MCH 31.6  MCHC 33.6  RDW 12.5   ------------------------------------------------------------------------------------------------------------------  Chemistries   Recent Labs Lab 08/11/15 1226  NA 134*  K 3.9  CL 105  CO2 22  GLUCOSE 108*  BUN 16  CREATININE 0.74  CALCIUM 9.6   ------------------------------------------------------------------------------------------------------------------ CrCl cannot be calculated (Unknown ideal weight.). ------------------------------------------------------------------------------------------------------------------ No results for input(s): TSH, T4TOTAL, T3FREE, THYROIDAB in the last 72 hours.  Invalid input(s): FREET3  Coagulation profile No results for input(s): INR, PROTIME in the last 168 hours. ------------------------------------------------------------------------------------------------------------------- No results for input(s): DDIMER in the last 72 hours. -------------------------------------------------------------------------------------------------------------------  Cardiac Enzymes No results for input(s): CKMB, TROPONINI, MYOGLOBIN in the last 168 hours.  Invalid input(s): CK ------------------------------------------------------------------------------------------------------------------ No results found for:  BNP   ---------------------------------------------------------------------------------------------------------------  Urinalysis    Component Value Date/Time   COLORURINE YELLOW 08/11/2015 1318   APPEARANCEUR CLEAR 08/11/2015 1318   LABSPEC 1.010 08/11/2015 1318   PHURINE 6.5 08/11/2015 1318   GLUCOSEU NEGATIVE 08/11/2015 1318   HGBUR NEGATIVE 08/11/2015 1318   BILIRUBINUR NEGATIVE 08/11/2015 1318   KETONESUR NEGATIVE 08/11/2015 1318   PROTEINUR NEGATIVE 08/11/2015 1318   UROBILINOGEN 1.0 09/09/2008 1531   NITRITE NEGATIVE 08/11/2015 1318   LEUKOCYTESUR NEGATIVE 08/11/2015 1318    ----------------------------------------------------------------------------------------------------------------   Imaging Results:    Dg Chest 2 View  08/11/2015  CLINICAL DATA:  Shortness of breath and mid chest pain since 3 a.m. this morning. History of stroke of seizures. EXAM: CHEST  2 VIEW COMPARISON:  Chest x-rays dated 07/12/2015 and 04/26/2012. FINDINGS: Heart size is normal. Overall cardiomediastinal silhouette is stable in size and configuration. Study is slightly hypoinspiratory with crowding of the perihilar and bibasilar bronchovascular markings. Patchy opacities at each lung base, right slightly greater than left, are likely associated atelectasis. No pleural effusion or pneumothorax seen. Osseous and soft tissue structures about the chest are unremarkable. IMPRESSION: 1. Low lung volumes. Patchy opacities at each lung base are probably associated atelectasis, but pneumonia cannot be excluded (especially if febrile). 2. Heart size is normal. Electronically Signed   By: Bary RichardStan  Maynard M.D.   On: 08/11/2015 13:17   Ct Angio Chest Pe W/cm &/or Wo Cm  08/11/2015  CLINICAL DATA:  Shortness of breath for 1 day EXAM: CT ANGIOGRAPHY CHEST WITH CONTRAST TECHNIQUE: Multidetector CT imaging of the chest was performed using the standard protocol during bolus administration of intravenous contrast.  Multiplanar CT image reconstructions and MIPs were obtained to evaluate the vascular anatomy. CONTRAST:  100 mL Isovue 370 nonionic COMPARISON:  Chest CT May 12, 2012 and chest radiograph August 11, 2015 FINDINGS: Cardiovascular: There is no demonstrable pulmonary embolus. There is no thoracic aortic aneurysm or dissection. The visualized great vessels appear unremarkable. There is no appreciable pericardial thickening. Mediastinum/Nodes: Thyroid appears unremarkable. There is no appreciable thoracic adenopathy. 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 This apparent lipoma has increased in size compared to the 2014 study, now measuring 4.3 x 3.4 x 3.2 cm. No other mediastinal mass is evident. Lungs/Pleura: There is patchy airspace opacity consistent with pneumonia throughout multiple segments of both lower lobes as well as in the posterior segment of the right upper lobe.  Upper Abdomen: In the visualized upper abdomen, there is hepatic steatosis. The visualized upper abdominal structures otherwise appear normal. Musculoskeletal: There is no blastic or lytic bone lesion evident. Review of the MIP images confirms the above findings. IMPRESSION: No demonstrable pulmonary embolus. Multifocal pneumonia with pneumonia involving portions of most segments of each lower lobe as well as the posterior segment of the right upper lobe. No demonstrable adenopathy. 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 This lipoma appears benign but has increased in size compared to the 2014 study. Hepatic steatosis. Electronically Signed   By: Bretta Bang III M.D.   On: 08/11/2015 14:07    My personal review of EKG: Sinus Tachycardia at 158 with posterior vascular block, prolonged qtc of 579     Assessment & Plan:    Principal Problem:   Sepsis (HCC) Secondary to multilobar pneumonia. Sepsis pathway initiated in  the ED. Received 2 L normal saline bolus in the ED. Ordered for a third liter followed by maintenance fluid. Admit to telemetry. Follow lactic acid level. Order blood culture, sputum culture, urine for strep antigen and Legionella. Will place her on IV zosyn empirically. (Avoid macrolides and quinolones  due to prolonged QT). Supportive care with Tylenol and antitussives. Has pleuritic chest pain symptoms. When necessary Toradol and Vicodin.  Active Problems: Multilobar pneumonia Plan as outlined above.  Prolonged QTC (579) Potassium normal. Check magnesium level and replenish as needed. Monitor on telemetry. Discontinue nortriptyline. Avoid QT prolonging agents.     H/O: ?CVA (cerebrovascular accident) Not on any medications.On reviewing discharge summary from 2010 patient was admitted for new onset seizures with negative MRI.  Chronic headache Continue Topamax  Fibromyalgia Resume belsomra and Flexeril. Discontinue nortriptyline.         DVT Prophylaxis Lovenox   AM Labs Ordered, also please review Full Orders  Family Communication: discussed in detail with patient , her husband and daughter at bedside  Code Status full code  Likely DC to  home  Condition fair  Consults called: none  Admission status: inpatient  Time spent in minutes : 70    Eddie North M.D on 08/11/2015 at 3:17 PM  Between 7am to 7pm - Pager - [phone]. After 7pm go to [email or web address] - password [facility]  [facility] - Office  [phone]

## 2015-08-11 NOTE — ED Notes (Signed)
Patient presents for centralized CP, SOB, dizziness, fever (101) starting this morning when she woke up. Denies N/V, weakness, diaphoresis.

## 2015-08-11 NOTE — ED Provider Notes (Signed)
CSN: [id number]     Arrival date & time 08/11/15  1213 History   First MD Initiated Contact with Patient 08/11/15 1224     Chief Complaint  Patient presents with  . Chest Pain  . Shortness of Breath  . Fever      Patient is a 45 y.o. female presenting with chest pain, shortness of breath, and fever. The history is provided by the patient.  Chest Pain Associated symptoms: cough, fatigue, fever and shortness of breath   Associated symptoms: no abdominal pain, no back pain and no dysphagia   Shortness of Breath Associated symptoms: chest pain, cough and fever   Associated symptoms: no abdominal pain   Fever Associated symptoms: chest pain and cough   Associated symptoms: no congestion   Patient presents with shortness of breath fever. Began last night with a cough when she woke up in the middle and night. States she's felt bad since. No cough now. States she is achy. No nausea vomiting diarrhea. No dysuria. Mild swelling in both her legs. No sick contacts with URI symptoms but states her sister is being treated for urinary tract infection. States she has been urinating a little more frequently, but states she's been drinking more water also.  Past Medical History  Diagnosis Date  . Stroke (HCC)   . Arthritis   . Seizures (HCC)   . Hearing loss     left ear  . Chronic headaches   . Anxiety, mild   . Bulging disc     noncompressive  . Myalgia and myositis, unspecified 01/04/2013  . Headache(784.0) 01/04/2013  . Depression with anxiety 01/04/2013  . Insomnia 01/04/2013  . Tremor 01/04/2013  . Obesity    Past Surgical History  Procedure Laterality Date  . Tubal ligation    . Ankle surgery      Left, after surgery  . Fracture surgery     Family History  Problem Relation Age of Onset  . Cancer Mother     breast cancer  . Hypertension Mother   . COPD Father   . Heart disease Father   . Cancer Sister   . Cancer Maternal Grandmother   . Cancer Maternal Grandfather   .  Neurofibromatosis Maternal Aunt    Social History  Substance Use Topics  . Smoking status: Never Smoker   . Smokeless tobacco: Never Used  . Alcohol Use: No   OB History    No data available     Review of Systems  Constitutional: Positive for fever, appetite change and fatigue.  HENT: Negative for congestion and trouble swallowing.   Respiratory: Positive for cough and shortness of breath.   Cardiovascular: Positive for chest pain.  Gastrointestinal: Negative for abdominal pain.  Genitourinary: Positive for frequency.  Musculoskeletal: Negative for back pain.  Skin: Negative for wound.      Allergies  Lyrica  Home Medications   Prior to Admission medications   Medication Sig Start Date End Date Taking? Authorizing Provider  cholecalciferol (VITAMIN D) 1000 UNITS tablet Take 1,000 Units by mouth daily.   Yes Historical Provider, MD  cyanocobalamin 500 MCG tablet Take 500 mcg by mouth daily.   Yes Historical Provider, MD  cyclobenzaprine (FLEXERIL) 10 MG tablet Take 20 mg by mouth at bedtime.  08/04/15  Yes Historical Provider, MD  famotidine (PEPCID) 20 MG tablet Take 1 tablet (20 mg total) by mouth 2 (two) times daily. Patient taking differently: Take 20 mg by mouth 2 (two) times daily as  needed for heartburn or indigestion.  07/13/15  Yes April Palumbo, MD  HYDROcodone-acetaminophen (NORCO) 7.5-325 MG per tablet Take 1 tablet by mouth 2 (two) times daily as needed for moderate pain.  12/26/12  Yes Historical Provider, MD  magnesium gluconate (MAGONATE) 500 MG tablet Take 500 mg by mouth daily.   Yes Historical Provider, MD  Multiple Vitamin (MULTIVITAMIN WITH MINERALS) TABS Take 1 tablet by mouth daily.   Yes Historical Provider, MD  nortriptyline (PAMELOR) 75 MG capsule Take 75 mg by mouth at bedtime. 08/04/15  Yes Historical Provider, MD  Suvorexant (BELSOMRA) 10 MG TABS Take 10 mg by mouth at bedtime as needed (sleep).    Yes Historical Provider, MD  topiramate (TOPAMAX) 25  MG tablet Take 50 mg by mouth at bedtime. 08/04/15  Yes Historical Provider, MD  pregabalin (LYRICA) 50 MG capsule 1 capsule at night for one week, then take one capsule twice daily for one week, then take one capsule in the morning, and 2 capsules in the evening Patient not taking: Reported on 08/11/2015 02/06/13   York Spanielharles K Willis, MD   BP 111/69 mmHg  Pulse 105  Temp(Src) 98.6 F (37 C) (Oral)  Resp 16  Ht 5\' 7"  (1.702 m)  Wt 240 lb 15.4 oz (109.3 kg)  BMI 37.73 kg/m2  SpO2 100%  LMP 07/28/2015 Physical Exam  Constitutional: She appears well-developed.  HENT:  Head: Atraumatic.  Eyes: Pupils are equal, round, and reactive to light.  Neck: Neck supple.  Cardiovascular:  tachycardia  Pulmonary/Chest: No respiratory distress. She has no wheezes. She has no rales.  Abdominal: Soft. There is no tenderness.  Musculoskeletal: She exhibits no tenderness.  Neurological: She is alert.  Skin: Skin is warm.    ED Course  Procedures (including critical care time) Labs Review Labs Reviewed  BASIC METABOLIC PANEL - Abnormal; Notable for the following:    Sodium 134 (*)    Glucose, Bld 108 (*)    All other components within normal limits  MAGNESIUM - Abnormal; Notable for the following:    Magnesium 1.6 (*)    All other components within normal limits  BASIC METABOLIC PANEL - Abnormal; Notable for the following:    CO2 20 (*)    Glucose, Bld 111 (*)    Calcium 8.0 (*)    All other components within normal limits  MAGNESIUM - Abnormal; Notable for the following:    Magnesium 2.5 (*)    All other components within normal limits  CK - Abnormal; Notable for the following:    Total CK 34 (*)    All other components within normal limits  I-STAT CG4 LACTIC ACID, ED - Abnormal; Notable for the following:    Lactic Acid, Venous 1.91 (*)    All other components within normal limits  CULTURE, BLOOD (ROUTINE X 2)  CULTURE, BLOOD (ROUTINE X 2)  CULTURE, EXPECTORATED SPUTUM-ASSESSMENT   GRAM STAIN  CBC  URINALYSIS, ROUTINE W REFLEX MICROSCOPIC (NOT [facility])  PROTIME-INR  APTT  LACTIC ACID, PLASMA  LACTIC ACID, PLASMA  PROCALCITONIN  HIV ANTIBODY (ROUTINE TESTING)  TSH  TROPONIN I  LEGIONELLA PNEUMOPHILA SEROGP 1 UR AG  STREP PNEUMONIAE URINARY ANTIGEN  I-STAT TROPOININ, ED    Imaging Review Dg Chest 2 View  08/11/2015  CLINICAL DATA:  Shortness of breath and mid chest pain since 3 a.m. this morning. History of stroke of seizures. EXAM: CHEST  2 VIEW COMPARISON:  Chest x-rays dated 07/12/2015 and 04/26/2012. FINDINGS: Heart size is  normal. Overall cardiomediastinal silhouette is stable in size and configuration. Study is slightly hypoinspiratory with crowding of the perihilar and bibasilar bronchovascular markings. Patchy opacities at each lung base, right slightly greater than left, are likely associated atelectasis. No pleural effusion or pneumothorax seen. Osseous and soft tissue structures about the chest are unremarkable. IMPRESSION: 1. Low lung volumes. Patchy opacities at each lung base are probably associated atelectasis, but pneumonia cannot be excluded (especially if febrile). 2. Heart size is normal. Electronically Signed   By: Bary Richard M.D.   On: 08/11/2015 13:17   Ct Angio Chest Pe W/cm &/or Wo Cm  08/11/2015  CLINICAL DATA:  Shortness of breath for 1 day EXAM: CT ANGIOGRAPHY CHEST WITH CONTRAST TECHNIQUE: Multidetector CT imaging of the chest was performed using the standard protocol during bolus administration of intravenous contrast. Multiplanar CT image reconstructions and MIPs were obtained to evaluate the vascular anatomy. CONTRAST:  100 mL Isovue 370 nonionic COMPARISON:  Chest CT May 12, 2012 and chest radiograph August 11, 2015 FINDINGS: Cardiovascular: There is no demonstrable pulmonary embolus. There is no thoracic aortic aneurysm or dissection. The visualized great vessels appear unremarkable. There is no appreciable pericardial thickening.  Mediastinum/Nodes: Thyroid appears unremarkable. There is no appreciable thoracic adenopathy. 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 This apparent lipoma has increased in size compared to the 2014 study, now measuring 4.3 x 3.4 x 3.2 cm. No other mediastinal mass is evident. Lungs/Pleura: There is patchy airspace opacity consistent with pneumonia throughout multiple segments of both lower lobes as well as in the posterior segment of the right upper lobe. Upper Abdomen: In the visualized upper abdomen, there is hepatic steatosis. The visualized upper abdominal structures otherwise appear normal. Musculoskeletal: There is no blastic or lytic bone lesion evident. Review of the MIP images confirms the above findings. IMPRESSION: No demonstrable pulmonary embolus. Multifocal pneumonia with pneumonia involving portions of most segments of each lower lobe as well as the posterior segment of the right upper lobe. No demonstrable adenopathy. 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 This lipoma appears benign but has increased in size compared to the 2014 study. Hepatic steatosis. Electronically Signed   By: Bretta Bang III M.D.   On: 08/11/2015 14:07   I have personally reviewed and evaluated these images and lab results as part of my medical decision-making.   EKG Interpretation   Date/Time:  Monday August 11 2015 12:22:45 EDT Ventricular Rate:  158 PR Interval:    QRS Duration: 100 QT Interval:  357 QTC Calculation: 579 R Axis:   168 Text Interpretation:  Sinus tachycardia Left posterior fascicular block  Low voltage with right axis deviation Abnormal R-wave progression, late  transition Prolonged QT interval Confirmed by Rubin Payor  MD, Harrold Donath  [phone]) on 08/11/2015 12:45:22 PM      MDM   Final diagnoses:  CAP (community acquired pneumonia)    Patient with chest pain shortness  of breath. Continue tachycardia. Found to have possible pneumonia on x-ray and CT scan done to evaluate further and evaluate her pulmonary embolism. Has multifocal pneumonia on CT. Admit to internal medicine.    Benjiman Core, MD 08/12/15 [phone]

## 2015-08-11 NOTE — ED Notes (Signed)
MD at bedside. Hospitalist

## 2015-08-11 NOTE — ED Notes (Signed)
E[facility] 10 minutes [facility] [place]

## 2015-08-11 NOTE — ED Notes (Signed)
Family at bedside.sister

## 2015-08-12 ENCOUNTER — Inpatient Hospital Stay (HOSPITAL_COMMUNITY): Payer: Self-pay

## 2015-08-12 DIAGNOSIS — R000 Tachycardia, unspecified: Secondary | ICD-10-CM

## 2015-08-12 DIAGNOSIS — R002 Palpitations: Secondary | ICD-10-CM

## 2015-08-12 LAB — BASIC METABOLIC PANEL
Anion gap: 6 (ref 5–15)
BUN: 14 mg/dL (ref 6–20)
CALCIUM: 8 mg/dL — AB (ref 8.9–10.3)
CO2: 20 mmol/L — ABNORMAL LOW (ref 22–32)
CREATININE: 0.84 mg/dL (ref 0.44–1.00)
Chloride: 109 mmol/L (ref 101–111)
Glucose, Bld: 111 mg/dL — ABNORMAL HIGH (ref 65–99)
Potassium: 4.2 mmol/L (ref 3.5–5.1)
SODIUM: 135 mmol/L (ref 135–145)

## 2015-08-12 LAB — TSH: TSH: 1.329 u[IU]/mL (ref 0.350–4.500)

## 2015-08-12 LAB — STREP PNEUMONIAE URINARY ANTIGEN: STREP PNEUMO URINARY ANTIGEN: NEGATIVE

## 2015-08-12 LAB — CK: CK TOTAL: 34 U/L — AB (ref 38–234)

## 2015-08-12 LAB — HIV ANTIBODY (ROUTINE TESTING W REFLEX): HIV Screen 4th Generation wRfx: NONREACTIVE

## 2015-08-12 LAB — ECHOCARDIOGRAM COMPLETE
Height: 67 in
Weight: 3855.4 oz

## 2015-08-12 LAB — TROPONIN I

## 2015-08-12 LAB — MAGNESIUM: MAGNESIUM: 2.5 mg/dL — AB (ref 1.7–2.4)

## 2015-08-12 MED ORDER — PROCHLORPERAZINE EDISYLATE 5 MG/ML IJ SOLN
10.0000 mg | Freq: Four times a day (QID) | INTRAMUSCULAR | Status: AC | PRN
  Administered 2015-08-13 (×2): 10 mg via INTRAVENOUS
  Filled 2015-08-12 (×2): qty 2

## 2015-08-12 NOTE — Progress Notes (Signed)
PROGRESS NOTE                                                                                                                                                                                                             Patient Demographics:    Heather Simon, is a 45 y.o. female, DOB - 03-05-1970, [id number]  Admit date - 08/11/2015   Admitting Physician Eddie North, MD  Outpatient Primary MD for the patient is Diamantina Providence, FNP  LOS - 1  Outpatient Specialists:   Chief Complaint  Patient presents with  . Chest Pain  . Shortness of Breath  . Fever       Brief Narrative     Subjective:   Still has chest discomfort and non productive cough. Reported being unable to lie flat overnight due to dyspnea. Heart rate up to 140s on the monitor.   Assessment  & Plan :    Principal Problem:   Sepsis (HCC) Secondary to multilobar pneumonia. On empiric Zosyn (avoided quinolones and macrolides due to prolonged QTC.) Follow cultures. Supportive care with Tylenol and antitussives. When necessary pain medications for pleuritic chest pain symptoms.  Active Problems: Chest pain  Substernal and pleuritic. Worsened on deep inspiration and cough. EKG showed sinus tachycardia with no ST changes. Troponin negative. CK normal. Continue antitussive. When necessary Toradol and Vicodin for pain      Lobar pneumonia due to unspecified organism As outlined above. Continue empiric antibiotics  Sinus tachycardia Suspected pneumonia with sepsis however given persistent symptoms, shortness of breath despite resolution of fever and adequate hydration I will check 2-D echo. TSH normal. Troponin 1 negative.    Prolonged QT interval Held  Fibromyalgia/anxiety and depression Resume home medications. Held nortriptyline due to prolonged QT.  Chronic headache Topamax    1.    Code Status : Full code  Family Communication   : Family at bedside  Disposition Plan  : Home possibly in 48-72 hours if continues to improve  Barriers For Discharge : Ongoing symptoms  Consults  : None  Procedures  :  2-D echo  DVT Prophylaxis  :  Lovenox -   Lab Results  Component Value Date   PLT 162 08/11/2015    Antibiotics  :    Anti-infectives    Start     Dose/Rate Route Frequency  Ordered Stop   08/12/15 1000  cefTRIAXone (ROCEPHIN) 1 g in dextrose 5 % 50 mL IVPB  Status:  Discontinued     1 g 100 mL/hr over 30 Minutes Intravenous Every 24 hours 08/11/15 1506 08/11/15 1507   08/12/15 1000  azithromycin (ZITHROMAX) 500 mg in dextrose 5 % 250 mL IVPB  Status:  Discontinued     500 mg 250 mL/hr over 60 Minutes Intravenous Every 24 hours 08/11/15 1506 08/11/15 1507   08/12/15 0800  levofloxacin (LEVAQUIN) IVPB 750 mg  Status:  Discontinued     750 mg 100 mL/hr over 90 Minutes Intravenous Every 24 hours 08/11/15 1618 08/11/15 1637   08/11/15 1800  piperacillin-tazobactam (ZOSYN) IVPB 3.375 g     3.375 g 12.5 mL/hr over 240 Minutes Intravenous Every 8 hours 08/11/15 1632     08/11/15 1430  cefTRIAXone (ROCEPHIN) 1 g in dextrose 5 % 50 mL IVPB     1 g 100 mL/hr over 30 Minutes Intravenous  Once 08/11/15 1421 08/11/15 1528   08/11/15 1430  azithromycin (ZITHROMAX) 500 mg in dextrose 5 % 250 mL IVPB  Status:  Discontinued     500 mg 250 mL/hr over 60 Minutes Intravenous  Once 08/11/15 1421 08/11/15 1524        Objective:   Filed Vitals:   08/11/15 1501 08/11/15 1619 08/11/15 2254 08/12/15 0656  BP: 114/73 116/78 115/76 106/63  Pulse: 128 130 106 98  Temp: 100 F (37.8 C) 99.3 F (37.4 C) 98.3 F (36.8 C) 98.2 F (36.8 C)  TempSrc: Oral Oral Oral Oral  Resp: 13 18 18 16   Height:  5\' 7"  (1.702 m)    Weight:  109.3 kg (240 lb 15.4 oz)    SpO2: 99% 99% 100% 100%    Wt Readings from Last 3 Encounters:  08/11/15 109.3 kg (240 lb 15.4 oz)  07/12/15 97.523 kg (215 lb)  01/04/13 102.513 kg (226 lb)      Intake/Output Summary (Last 24 hours) at 08/12/15 1238 Last data filed at 08/12/15 1022  Gross per 24 hour  Intake 3773.33 ml  Output   2300 ml  Net 1473.33 ml     Physical Exam  Gen: fatigued HEENT: moist mucosa, supple neck Chest: fine crackles at lung bases CVS: S1&S2 tachycardic, no murmurs, rubs or gallop GI: soft, NT, ND, BS+ Musculoskeletal: warm, no edema     Data Review:    CBC  Recent Labs Lab 08/11/15 1226  WBC 9.0  HGB 13.7  HCT 40.8  PLT 162  MCV 94.0  MCH 31.6  MCHC 33.6  RDW 12.5    Chemistries   Recent Labs Lab 08/11/15 1225 08/11/15 1226 08/12/15 1042  NA  --  134* 135  K  --  3.9 4.2  CL  --  105 109  CO2  --  22 20*  GLUCOSE  --  108* 111*  BUN  --  16 14  CREATININE  --  0.74 0.84  CALCIUM  --  9.6 8.0*  MG 1.6*  --  2.5*   ------------------------------------------------------------------------------------------------------------------ No results for input(s): CHOL, HDL, LDLCALC, TRIG, CHOLHDL, LDLDIRECT in the last 72 hours.  No results found for: HGBA1C ------------------------------------------------------------------------------------------------------------------  Recent Labs  08/12/15 1042  TSH 1.329   ------------------------------------------------------------------------------------------------------------------ No results for input(s): VITAMINB12, FOLATE, FERRITIN, TIBC, IRON, RETICCTPCT in the last 72 hours.  Coagulation profile  Recent Labs Lab 08/11/15 1225  INR 1.02    No results for input(s): DDIMER in the  last 72 hours.  Cardiac Enzymes  Recent Labs Lab 08/12/15 1042  TROPONINI <0.03   ------------------------------------------------------------------------------------------------------------------ No results found for: BNP  Inpatient Medications  Scheduled Meds: . cholecalciferol  1,000 Units Oral Daily  . cyclobenzaprine  20 mg Oral QHS  . enoxaparin (LOVENOX) injection  40 mg  Subcutaneous Q24H  . guaiFENesin  1,200 mg Oral BID  . magnesium gluconate  500 mg Oral Daily  . multivitamin with minerals  1 tablet Oral Daily  . piperacillin-tazobactam (ZOSYN)  IV  3.375 g Intravenous Q8H  . topiramate  50 mg Oral QHS  . cyanocobalamin  500 mcg Oral Daily   Continuous Infusions: . sodium chloride 100 mL/hr at 08/12/15 1146   PRN Meds:.alum & mag hydroxide-simeth, HYDROcodone-acetaminophen, ketorolac, sodium chloride, Suvorexant  Micro Results No results found for this or any previous visit (from the past 240 hour(s)).  Radiology Reports Dg Chest 2 View  08/11/2015  CLINICAL DATA:  Shortness of breath and mid chest pain since 3 a.m. this morning. History of stroke of seizures. EXAM: CHEST  2 VIEW COMPARISON:  Chest x-rays dated 07/12/2015 and 04/26/2012. FINDINGS: Heart size is normal. Overall cardiomediastinal silhouette is stable in size and configuration. Study is slightly hypoinspiratory with crowding of the perihilar and bibasilar bronchovascular markings. Patchy opacities at each lung base, right slightly greater than left, are likely associated atelectasis. No pleural effusion or pneumothorax seen. Osseous and soft tissue structures about the chest are unremarkable. IMPRESSION: 1. Low lung volumes. Patchy opacities at each lung base are probably associated atelectasis, but pneumonia cannot be excluded (especially if febrile). 2. Heart size is normal. Electronically Signed   By: Bary Richard M.D.   On: 08/11/2015 13:17   Ct Angio Chest Pe W/cm &/or Wo Cm  08/11/2015  CLINICAL DATA:  Shortness of breath for 1 day EXAM: CT ANGIOGRAPHY CHEST WITH CONTRAST TECHNIQUE: Multidetector CT imaging of the chest was performed using the standard protocol during bolus administration of intravenous contrast. Multiplanar CT image reconstructions and MIPs were obtained to evaluate the vascular anatomy. CONTRAST:  100 mL Isovue 370 nonionic COMPARISON:  Chest CT May 12, 2012 and chest  radiograph August 11, 2015 FINDINGS: Cardiovascular: There is no demonstrable pulmonary embolus. There is no thoracic aortic aneurysm or dissection. The visualized great vessels appear unremarkable. There is no appreciable pericardial thickening. Mediastinum/Nodes: Thyroid appears unremarkable. There is no appreciable thoracic adenopathy. 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 This apparent lipoma has increased in size compared to the 2014 study, now measuring 4.3 x 3.4 x 3.2 cm. No other mediastinal mass is evident. Lungs/Pleura: There is patchy airspace opacity consistent with pneumonia throughout multiple segments of both lower lobes as well as in the posterior segment of the right upper lobe. Upper Abdomen: In the visualized upper abdomen, there is hepatic steatosis. The visualized upper abdominal structures otherwise appear normal. Musculoskeletal: There is no blastic or lytic bone lesion evident. Review of the MIP images confirms the above findings. IMPRESSION: No demonstrable pulmonary embolus. Multifocal pneumonia with pneumonia involving portions of most segments of each lower lobe as well as the posterior segment of the right upper lobe. No demonstrable adenopathy. 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 This lipoma appears benign but has increased in size compared to the 2014 study. Hepatic steatosis. Electronically Signed   By: Bretta Bang III M.D.   On: 08/11/2015 14:07    Time  Spent in minutes  25   Eddie North M.D on 08/12/2015 at 12:38 PM  Between 7am to 7pm - Pager - [phone]  After 7pm go to [email or web address] - password [facility]  [facility] -  Office  [phone]

## 2015-08-12 NOTE — Progress Notes (Signed)
  Echocardiogram 2D Echocardiogram has been performed.  Janalyn HarderWest, Kaysen Sefcik R 08/12/2015, 2:08 PM

## 2015-08-13 DIAGNOSIS — A419 Sepsis, unspecified organism: Principal | ICD-10-CM

## 2015-08-13 DIAGNOSIS — J181 Lobar pneumonia, unspecified organism: Secondary | ICD-10-CM

## 2015-08-13 NOTE — Progress Notes (Signed)
PROGRESS NOTE                                                                                                                                                                                                             Patient Demographics:    Heather Simon, is a 45 y.o. female, DOB - May 27, 1970, [id number]  Admit date - 08/11/2015   Admitting Physician Eddie NorthNishant Dhungel, MD  Outpatient Primary MD for the patient is Diamantina Providenceakela N Anderson, FNP  LOS - 2  Outpatient Specialists:   Chief Complaint  Patient presents with  . Chest Pain  . Shortness of Breath  . Fever       Brief Narrative     Subjective:  No new complaints reported. No acute issues reported overnight.    Assessment  & Plan :    Principal Problem:   Sepsis (HCC) Secondary to multilobar pneumonia. On empiric Zosyn (avoided quinolones and macrolides due to prolonged QTC.) Follow cultures. Supportive care with Tylenol and antitussives. When necessary pain medications for pleuritic chest pain symptoms.  Active Problems: Chest pain  Substernal and pleuritic. Worsened on deep inspiration and cough. EKG showed sinus tachycardia with no ST changes. Troponin negative. CK normal. Continue antitussive. When necessary Toradol and Vicodin for pain    Lobar pneumonia due to unspecified organism As outlined above.  Continue empiric antibiotics  Sinus tachycardia Suspected pneumonia with sepsis however given persistent symptoms, Echo normal. TSH normal. Troponin 1 negative.    Prolonged QT interval Held  Fibromyalgia/anxiety and depression Resume home medications. Held nortriptyline due to prolonged QT.  Chronic headache Topamax   Code Status : Full code  Family Communication  : Family at bedside  Disposition Plan  : Home possibly in 48-72 hours if continues to improve  Barriers For Discharge : Ongoing symptoms  Consults  : None  Procedures  :    2-D echo  DVT Prophylaxis  :  Lovenox -   Lab Results  Component Value Date   PLT 162 08/11/2015    Antibiotics  :    Anti-infectives    Start     Dose/Rate Route Frequency Ordered Stop   08/12/15 1000  cefTRIAXone (ROCEPHIN) 1 g in dextrose 5 % 50 mL IVPB  Status:  Discontinued     1 g 100 mL/hr over 30 Minutes Intravenous Every  24 hours 08/11/15 1506 08/11/15 1507   08/12/15 1000  azithromycin (ZITHROMAX) 500 mg in dextrose 5 % 250 mL IVPB  Status:  Discontinued     500 mg 250 mL/hr over 60 Minutes Intravenous Every 24 hours 08/11/15 1506 08/11/15 1507   08/12/15 0800  levofloxacin (LEVAQUIN) IVPB 750 mg  Status:  Discontinued     750 mg 100 mL/hr over 90 Minutes Intravenous Every 24 hours 08/11/15 1618 08/11/15 1637   08/11/15 1800  piperacillin-tazobactam (ZOSYN) IVPB 3.375 g     3.375 g 12.5 mL/hr over 240 Minutes Intravenous Every 8 hours 08/11/15 1632     08/11/15 1430  cefTRIAXone (ROCEPHIN) 1 g in dextrose 5 % 50 mL IVPB     1 g 100 mL/hr over 30 Minutes Intravenous  Once 08/11/15 1421 08/11/15 1528   08/11/15 1430  azithromycin (ZITHROMAX) 500 mg in dextrose 5 % 250 mL IVPB  Status:  Discontinued     500 mg 250 mL/hr over 60 Minutes Intravenous  Once 08/11/15 1421 08/11/15 1524        Objective:   Filed Vitals:   08/12/15 0656 08/12/15 1430 08/12/15 2044 08/13/15 0638  BP: 106/63 111/69 114/99 125/80  Pulse: 98 105 107 104  Temp: 98.2 F (36.8 C) 98.6 F (37 C) 100.2 F (37.9 C) 99.1 F (37.3 C)  TempSrc: Oral Oral Oral Oral  Resp: 16 16  16   Height:      Weight:      SpO2: 100% 100%  100%    Wt Readings from Last 3 Encounters:  08/11/15 109.3 kg (240 lb 15.4 oz)  07/12/15 97.523 kg (215 lb)  01/04/13 102.513 kg (226 lb)     Intake/Output Summary (Last 24 hours) at 08/13/15 1606 Last data filed at 08/13/15 0900  Gross per 24 hour  Intake    690 ml  Output      0 ml  Net    690 ml     Physical Exam  Gen: pt in nad, alert and  awake HEENT: moist mucosa, supple neck, atraumatic Chest: fine crackles at lung bases, no wheezes, equal chest rise.  CVS: S1&S2 tachycardic, no murmurs, rubs or gallop GI: soft, NT, ND, BS+ Musculoskeletal: warm, no edema     Data Review:    CBC  Recent Labs Lab 08/11/15 1226  WBC 9.0  HGB 13.7  HCT 40.8  PLT 162  MCV 94.0  MCH 31.6  MCHC 33.6  RDW 12.5    Chemistries   Recent Labs Lab 08/11/15 1225 08/11/15 1226 08/12/15 1042  NA  --  134* 135  K  --  3.9 4.2  CL  --  105 109  CO2  --  22 20*  GLUCOSE  --  108* 111*  BUN  --  16 14  CREATININE  --  0.74 0.84  CALCIUM  --  9.6 8.0*  MG 1.6*  --  2.5*   ------------------------------------------------------------------------------------------------------------------ No results for input(s): CHOL, HDL, LDLCALC, TRIG, CHOLHDL, LDLDIRECT in the last 72 hours.  No results found for: HGBA1C ------------------------------------------------------------------------------------------------------------------  Recent Labs  08/12/15 1042  TSH 1.329   ------------------------------------------------------------------------------------------------------------------ No results for input(s): VITAMINB12, FOLATE, FERRITIN, TIBC, IRON, RETICCTPCT in the last 72 hours.  Coagulation profile  Recent Labs Lab 08/11/15 1225  INR 1.02    No results for input(s): DDIMER in the last 72 hours.  Cardiac Enzymes  Recent Labs Lab 08/12/15 1042  TROPONINI <0.03   ------------------------------------------------------------------------------------------------------------------ No results found for:  BNP  Inpatient Medications  Scheduled Meds: . cholecalciferol  1,000 Units Oral Daily  . cyclobenzaprine  20 mg Oral QHS  . enoxaparin (LOVENOX) injection  40 mg Subcutaneous Q24H  . guaiFENesin  1,200 mg Oral BID  . magnesium gluconate  500 mg Oral Daily  . multivitamin with minerals  1 tablet Oral Daily  .  piperacillin-tazobactam (ZOSYN)  IV  3.375 g Intravenous Q8H  . topiramate  50 mg Oral QHS  . cyanocobalamin  500 mcg Oral Daily   Continuous Infusions: . sodium chloride 100 mL/hr at 08/12/15 2212   PRN Meds:.alum & mag hydroxide-simeth, HYDROcodone-acetaminophen, ketorolac, prochlorperazine, sodium chloride, Suvorexant  Micro Results Recent Results (from the past 240 hour(s))  Culture, blood (routine x 2) Call MD if unable to obtain prior to antibiotics being given     Status: None (Preliminary result)   Collection Time: 08/11/15  4:57 PM  Result Value Ref Range Status   Specimen Description BLOOD LEFT HAND  Final   Special Requests IN PEDIATRIC BOTTLE 4 CC  Final   Culture   Final    NO GROWTH 2 DAYS Performed at [facility]    Report Status PENDING  Incomplete  Culture, blood (routine x 2) Call MD if unable to obtain prior to antibiotics being given     Status: None (Preliminary result)   Collection Time: 08/11/15  4:57 PM  Result Value Ref Range Status   Specimen Description BLOOD RIGHT ARM  Final   Special Requests BOTTLES DRAWN AEROBIC AND ANAEROBIC 5 CC  Final   Culture   Final    NO GROWTH 2 DAYS Performed at [facility]    Report Status PENDING  Incomplete    Radiology Reports Dg Chest 2 View  08/11/2015  CLINICAL DATA:  Shortness of breath and mid chest pain since 3 a.m. this morning. History of stroke of seizures. EXAM: CHEST  2 VIEW COMPARISON:  Chest x-rays dated 07/12/2015 and 04/26/2012. FINDINGS: Heart size is normal. Overall cardiomediastinal silhouette is stable in size and configuration. Study is slightly hypoinspiratory with crowding of the perihilar and bibasilar bronchovascular markings. Patchy opacities at each lung base, right slightly greater than left, are likely associated atelectasis. No pleural effusion or pneumothorax seen. Osseous and soft tissue structures about the chest are unremarkable. IMPRESSION: 1. Low lung volumes. Patchy  opacities at each lung base are probably associated atelectasis, but pneumonia cannot be excluded (especially if febrile). 2. Heart size is normal. Electronically Signed   By: Bary Richard M.D.   On: 08/11/2015 13:17   Ct Angio Chest Pe W/cm &/or Wo Cm  08/11/2015  CLINICAL DATA:  Shortness of breath for 1 day EXAM: CT ANGIOGRAPHY CHEST WITH CONTRAST TECHNIQUE: Multidetector CT imaging of the chest was performed using the standard protocol during bolus administration of intravenous contrast. Multiplanar CT image reconstructions and MIPs were obtained to evaluate the vascular anatomy. CONTRAST:  100 mL Isovue 370 nonionic COMPARISON:  Chest CT May 12, 2012 and chest radiograph August 11, 2015 FINDINGS: Cardiovascular: There is no demonstrable pulmonary embolus. There is no thoracic aortic aneurysm or dissection. The visualized great vessels appear unremarkable. There is no appreciable pericardial thickening. Mediastinum/Nodes: Thyroid appears unremarkable. There is no appreciable thoracic adenopathy. 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 This apparent lipoma has increased in size compared to the 2014 study, now measuring 4.3 x 3.4 x 3.2 cm. No other mediastinal mass is evident. Lungs/Pleura:  There is patchy airspace opacity consistent with pneumonia throughout multiple segments of both lower lobes as well as in the posterior segment of the right upper lobe. Upper Abdomen: In the visualized upper abdomen, there is hepatic steatosis. The visualized upper abdominal structures otherwise appear normal. Musculoskeletal: There is no blastic or lytic bone lesion evident. Review of the MIP images confirms the above findings. IMPRESSION: No demonstrable pulmonary embolus. Multifocal pneumonia with pneumonia involving portions of most segments of each lower lobe as well as the posterior segment of the right upper lobe. No demonstrable adenopathy. Lipoma in  the inferior middle mediastinum which deviates the distal esophagus to the right. This lipoma appears benign but has increased in size compared to the 2014 study. Hepatic steatosis. Electronically Signed   By: Bretta Bang III M.D.   On: 08/11/2015 14:07    Time Spent in minutes  25   Penny Pia M.D on 08/13/2015 at 4:06 PM  Between 7am to 7pm - Pager - [phone]  After 7pm go to [email or web address] - password [facility]  [facility] -  Office  [phone]

## 2015-08-14 DIAGNOSIS — Z8673 Personal history of transient ischemic attack (TIA), and cerebral infarction without residual deficits: Secondary | ICD-10-CM

## 2015-08-14 LAB — LEGIONELLA PNEUMOPHILA SEROGP 1 UR AG: L. pneumophila Serogp 1 Ur Ag: NEGATIVE

## 2015-08-14 MED ORDER — CEFDINIR 300 MG PO CAPS: 300.0000 mg | ORAL_CAPSULE | Freq: Two times a day (BID) | ORAL | Status: DC

## 2015-08-14 NOTE — Discharge Summary (Signed)
Physician Discharge Summary  Heather Simon:[id number] DOB: 1971-01-01 DOA: 08/11/2015  PCP: Heather Providence, FNP  Admit date: 08/11/2015 Discharge date: 08/14/2015  Time spent: > 35 minutes  Recommendations for Outpatient Follow-up:  Please follow-up with your primary care physician in the next week. I will discharge on third generation cephalosporin for another 4 days of complete a seven-day antibiotic treatment regimen.  Discharge Diagnoses:  Principal Problem:   Sepsis (HCC) Active Problems:   Depression with anxiety   Lobar pneumonia due to unspecified organism   H/O: CVA (cerebrovascular accident)   Prolonged QT interval   Discharge Condition: stable  Diet recommendation: Heart healthy  Filed Weights   08/11/15 1619  Weight: 109.3 kg (240 lb 15.4 oz)    History of present illness:  45 y.o. female, With history of stroke in 2010 with hx of Seizures, chronic headache (follows with Dr. Anne Hahn), anxiety and depression, fibromyalgia, chronic fatigue who presented to the ED with acute onset of shortness of breath with substernal chest pain since last night. Was admitted and treated for sepsis  Hospital Course:   Principal Problem:  Sepsis (HCC) Secondary to multilobar pneumonia. Improved on Zosyn (avoided quinolones and macrolides due to prolonged QTC.) Follow cultures. Supportive care with Tylenol and antitussives.   Active Problems: Chest pain  Substernal and pleuritic. Worsened on deep inspiration and cough. EKG showed sinus tachycardia with no ST changes. Troponin negative. CK normal. Continue antitussive. When necessary Toradol and Vicodin for pain   Lobar pneumonia due to unspecified organism As outlined above.  Continue empiric antibiotics  Sinus tachycardia Suspected pneumonia with sepsis however given persistent symptoms, Echo normal. TSH normal. Troponin 1 negative.  Prolonged QT interval  Fibromyalgia/anxiety and depression Resume home  medications. Held nortriptyline due to prolonged QT.  Chronic headache Topamax  Procedures:  Echocardiogram  Consultations:  None  Discharge Exam: Filed Vitals:   08/14/15 0510 08/14/15 1312  BP: 112/64 108/74  Pulse: 90 93  Temp: 98.1 F (36.7 C) 98.3 F (36.8 C)  Resp: 18     General: Pt in nad, alert and awake Cardiovascular: rrr, no rubs Respiratory: no increased wob, no wheezes  Discharge Instructions   Discharge Instructions    Call MD for:  difficulty breathing, headache or visual disturbances    Complete by:  As directed      Call MD for:  severe uncontrolled pain    Complete by:  As directed      Call MD for:  temperature >100.4    Complete by:  As directed      Diet - low sodium heart healthy    Complete by:  As directed      Discharge instructions    Complete by:  As directed   Please follow-up with your primary care physician next week or 2 should any new concerns arise.     Increase activity slowly    Complete by:  As directed           Current Discharge Medication List    START taking these medications   Details  cefdinir (OMNICEF) 300 MG capsule Take 1 capsule (300 mg total) by mouth 2 (two) times daily. Qty: 8 capsule, Refills: 0      CONTINUE these medications which have NOT CHANGED   Details  cholecalciferol (VITAMIN D) 1000 UNITS tablet Take 1,000 Units by mouth daily.    cyanocobalamin 500 MCG tablet Take 500 mcg by mouth daily.    cyclobenzaprine (FLEXERIL) 10 MG  tablet Take 20 mg by mouth at bedtime.  Refills: 2    HYDROcodone-acetaminophen (NORCO) 7.5-325 MG per tablet Take 1 tablet by mouth 2 (two) times daily as needed for moderate pain.     magnesium gluconate (MAGONATE) 500 MG tablet Take 500 mg by mouth daily.    Multiple Vitamin (MULTIVITAMIN WITH MINERALS) TABS Take 1 tablet by mouth daily.    topiramate (TOPAMAX) 25 MG tablet Take 50 mg by mouth at bedtime. Refills: 5      STOP taking these medications      famotidine (PEPCID) 20 MG tablet      nortriptyline (PAMELOR) 75 MG capsule      Suvorexant (BELSOMRA) 10 MG TABS      pregabalin (LYRICA) 50 MG capsule        Allergies  Allergen Reactions  . Lyrica [Pregabalin] Other (See Comments)    Altered mental state/ weight gain    Follow-up Information    Follow up with [facility] On 09/19/2015.   Why:  Appointment at 9:00 AM.Please take discharge papers, all medications and ID with you.    Contact information:   [address] [place] [id number]        The results of significant diagnostics from this hospitalization (including imaging, microbiology, ancillary and laboratory) are listed below for reference.    Significant Diagnostic Studies: Dg Chest 2 View  08/11/2015  CLINICAL DATA:  Shortness of breath and mid chest pain since 3 a.m. this morning. History of stroke of seizures. EXAM: CHEST  2 VIEW COMPARISON:  Chest x-rays dated 07/12/2015 and 04/26/2012. FINDINGS: Heart size is normal. Overall cardiomediastinal silhouette is stable in size and configuration. Study is slightly hypoinspiratory with crowding of the perihilar and bibasilar bronchovascular markings. Patchy opacities at each lung base, right slightly greater than left, are likely associated atelectasis. No pleural effusion or pneumothorax seen. Osseous and soft tissue structures about the chest are unremarkable. IMPRESSION: 1. Low lung volumes. Patchy opacities at each lung base are probably associated atelectasis, but pneumonia cannot be excluded (especially if febrile). 2. Heart size is normal. Electronically Signed   By: Bary RichardStan  Maynard M.D.   On: 08/11/2015 13:17   Ct Angio Chest Pe W/cm &/or Wo Cm  08/11/2015  CLINICAL DATA:  Shortness of breath for 1 day EXAM: CT ANGIOGRAPHY CHEST WITH CONTRAST TECHNIQUE: Multidetector CT imaging of the chest was performed using the standard protocol during bolus administration of intravenous contrast.  Multiplanar CT image reconstructions and MIPs were obtained to evaluate the vascular anatomy. CONTRAST:  100 mL Isovue 370 nonionic COMPARISON:  Chest CT May 12, 2012 and chest radiograph August 11, 2015 FINDINGS: Cardiovascular: There is no demonstrable pulmonary embolus. There is no thoracic aortic aneurysm or dissection. The visualized great vessels appear unremarkable. There is no appreciable pericardial thickening. Mediastinum/Nodes: Thyroid appears unremarkable. There is no appreciable thoracic adenopathy. 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 This apparent lipoma has increased in size compared to the 2014 study, now measuring 4.3 x 3.4 x 3.2 cm. No other mediastinal mass is evident. Lungs/Pleura: There is patchy airspace opacity consistent with pneumonia throughout multiple segments of both lower lobes as well as in the posterior segment of the right upper lobe. Upper Abdomen: In the visualized upper abdomen, there is hepatic steatosis. The visualized upper abdominal structures otherwise appear normal. Musculoskeletal: There is no blastic or lytic bone lesion evident. Review of the MIP  images confirms the above findings. IMPRESSION: No demonstrable pulmonary embolus. Multifocal pneumonia with pneumonia involving portions of most segments of each lower lobe as well as the posterior segment of the right upper lobe. No demonstrable adenopathy. 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 This lipoma appears benign but has increased in size compared to the 2014 study. Hepatic steatosis. Electronically Signed   By: Bretta Bang III M.D.   On: 08/11/2015 14:07    Microbiology: Recent Results (from the past 240 hour(s))  Culture, blood (routine x 2) Call MD if unable to obtain prior to antibiotics being given     Status: None (Preliminary result)   Collection Time: 08/11/15  4:57 PM  Result  Value Ref Range Status   Specimen Description BLOOD LEFT HAND  Final   Special Requests IN PEDIATRIC BOTTLE 4 CC  Final   Culture   Final    NO GROWTH 3 DAYS Performed at [facility]    Report Status PENDING  Incomplete  Culture, blood (routine x 2) Call MD if unable to obtain prior to antibiotics being given     Status: None (Preliminary result)   Collection Time: 08/11/15  4:57 PM  Result Value Ref Range Status   Specimen Description BLOOD RIGHT ARM  Final   Special Requests BOTTLES DRAWN AEROBIC AND ANAEROBIC 5 CC  Final   Culture   Final    NO GROWTH 3 DAYS Performed at [facility]    Report Status PENDING  Incomplete     Labs: Basic Metabolic Panel:  Recent Labs Lab 08/11/15 1225 08/11/15 1226 08/12/15 1042  NA  --  134* 135  K  --  3.9 4.2  CL  --  105 109  CO2  --  22 20*  GLUCOSE  --  108* 111*  BUN  --  16 14  CREATININE  --  0.74 0.84  CALCIUM  --  9.6 8.0*  MG 1.6*  --  2.5*   Liver Function Tests: No results for input(s): AST, ALT, ALKPHOS, BILITOT, PROT, ALBUMIN in the last 168 hours. No results for input(s): LIPASE, AMYLASE in the last 168 hours. No results for input(s): AMMONIA in the last 168 hours. CBC:  Recent Labs Lab 08/11/15 1226  WBC 9.0  HGB 13.7  HCT 40.8  MCV 94.0  PLT 162   Cardiac Enzymes:  Recent Labs Lab 08/12/15 1042  CKTOTAL 34*  TROPONINI <0.03   BNP: BNP (last 3 results) No results for input(s): BNP in the last 8760 hours.  ProBNP (last 3 results) No results for input(s): PROBNP in the last 8760 hours.  CBG: No results for input(s): GLUCAP in the last 168 hours.  Signed:  Penny Pia MD.  [facility] 08/14/2015, 3:24 PM

## 2015-08-14 NOTE — Progress Notes (Signed)
Pharmacy Antibiotic Note  Heather Simon is a 45 y.o. female admitted on 08/11/2015 with sepsis 2/2 multilobar pneumonia.  MD would like to avoid macrolides and quinolones due to prolonged QT. Suggested Ceftriaxone + Doxycycline for CAP.  Pharmacy has been consulted for Zosyn dosing for now.  Dosage remains stable at 3.375 gr IV q8h  and need for further dosage adjustment appears unlikely at present.    Will sign off at this time.  Please reconsult if a change in clinical status warrants re-evaluation of dosage.   Plan: Zosyn 3.375g IV q8h (4 hour infusion time).   Height: 5\' 7"  (170.2 cm) Weight: 240 lb 15.4 oz (109.3 kg) IBW/kg (Calculated) : 61.6  Temp (24hrs), Avg:98.6 F (37 C), Min:98.1 F (36.7 C), Max:99 F (37.2 C)   Recent Labs Lab 08/11/15 1226 08/11/15 1246 08/11/15 1710 08/11/15 2003 08/12/15 1042  WBC 9.0  --   --   --   --   CREATININE 0.74  --   --   --  0.84  LATICACIDVEN  --  1.91* 1.7 1.7  --     Estimated Creatinine Clearance: 108.9 mL/min (by C-G formula based on Cr of 0.84).    Allergies  Allergen Reactions  . Lyrica [Pregabalin] Other (See Comments)    Altered mental state/ weight gain     Antimicrobials this admission: 7/17 Ceftriaxone x 1 7/17 Zosyn >>   Dose adjustments this admission: -  Microbiology results: 7/17 BCx: ngtd 7/17 Sputum: ordered   7/17 HIV ab: NR 7/17 strep pneumo ur ag: neg 7/17 legionella ur ag: IP  Thank you for allowing pharmacy to be a part of this patient's care.  Adalberto ColeNikola Durwin Davisson, PharmD, BCPS Pager [phone] 08/14/2015 12:31 PM

## 2015-08-16 LAB — CULTURE, BLOOD (ROUTINE X 2)
CULTURE: NO GROWTH
Culture: NO GROWTH

## 2015-09-19 ENCOUNTER — Ambulatory Visit: Payer: Self-pay | Admitting: Family Medicine

## 2016-04-27 ENCOUNTER — Encounter (HOSPITAL_COMMUNITY): Payer: Self-pay

## 2016-04-27 ENCOUNTER — Emergency Department (HOSPITAL_COMMUNITY): Payer: Self-pay

## 2016-04-27 ENCOUNTER — Emergency Department (HOSPITAL_COMMUNITY)
Admission: EM | Admit: 2016-04-27 | Discharge: 2016-04-28 | Disposition: A | Discharge status: Home / Self Care | Payer: Self-pay | Attending: Emergency Medicine | Admitting: Emergency Medicine

## 2016-04-27 DIAGNOSIS — R42 Dizziness and giddiness: Secondary | ICD-10-CM | POA: Insufficient documentation

## 2016-04-27 DIAGNOSIS — Z8673 Personal history of transient ischemic attack (TIA), and cerebral infarction without residual deficits: Secondary | ICD-10-CM | POA: Insufficient documentation

## 2016-04-27 DIAGNOSIS — R531 Weakness: Secondary | ICD-10-CM | POA: Insufficient documentation

## 2016-04-27 DIAGNOSIS — Z79899 Other long term (current) drug therapy: Secondary | ICD-10-CM | POA: Insufficient documentation

## 2016-04-27 LAB — CBG MONITORING, ED: Glucose-Capillary: 88 mg/dL (ref 65–99)

## 2016-04-27 LAB — BASIC METABOLIC PANEL
ANION GAP: 8 (ref 5–15)
BUN: 10 mg/dL (ref 6–20)
CHLORIDE: 103 mmol/L (ref 101–111)
CO2: 27 mmol/L (ref 22–32)
Calcium: 9.3 mg/dL (ref 8.9–10.3)
Creatinine, Ser: 0.75 mg/dL (ref 0.44–1.00)
GFR calc Af Amer: >60 mL/min (ref >60)
GFR calc non Af Amer: >60 mL/min (ref >60)
GLUCOSE: 108 mg/dL — AB (ref 65–99)
POTASSIUM: 3.8 mmol/L (ref 3.5–5.1)
SODIUM: 138 mmol/L (ref 135–145)

## 2016-04-27 LAB — CBC
HEMATOCRIT: 41.9 % (ref 36.0–46.0)
HEMOGLOBIN: 14.1 g/dL (ref 12.0–15.0)
MCH: 30.9 pg (ref 26.0–34.0)
MCHC: 33.7 g/dL (ref 30.0–36.0)
MCV: 91.9 fL (ref 78.0–100.0)
Platelets: 239 10*3/uL (ref 150–400)
RBC: 4.56 MIL/uL (ref 3.87–5.11)
RDW: 12.8 % (ref 11.5–15.5)
WBC: 9 10*3/uL (ref 4.0–10.5)

## 2016-04-27 LAB — MAGNESIUM: Magnesium: 1.9 mg/dL (ref 1.7–2.4)

## 2016-04-27 MED ORDER — SODIUM CHLORIDE 0.9 % IV SOLN: INTRAVENOUS | Status: DC

## 2016-04-27 MED ORDER — DIPHENHYDRAMINE HCL 50 MG/ML IJ SOLN
12.5000 mg | Freq: Once | INTRAMUSCULAR | Status: AC
  Administered 2016-04-27: 12.5 mg via INTRAVENOUS
  Filled 2016-04-27: qty 1

## 2016-04-27 MED ORDER — METOCLOPRAMIDE HCL 5 MG/ML IJ SOLN
10.0000 mg | Freq: Once | INTRAMUSCULAR | Status: AC
  Administered 2016-04-27: 10 mg via INTRAVENOUS
  Filled 2016-04-27: qty 2

## 2016-04-27 MED ORDER — SODIUM CHLORIDE 0.9 % IV BOLUS (SEPSIS)
2000.0000 mL | Freq: Once | INTRAVENOUS | Status: AC
  Administered 2016-04-27: 2000 mL via INTRAVENOUS

## 2016-04-27 MED ORDER — LORAZEPAM 2 MG/ML IJ SOLN
0.5000 mg | Freq: Once | INTRAMUSCULAR | Status: AC
  Administered 2016-04-27: 23:00:00 via INTRAVENOUS
  Filled 2016-04-27: qty 1

## 2016-04-27 NOTE — ED Provider Notes (Signed)
[facility]-EMERGENCY DEPT Provider Note   CSN: [id number] Arrival date & time: 04/27/16  2017     History   Chief Complaint Chief Complaint  Patient presents with  . Weakness    HPI Heather Simon is a 46 y.o. female.  46 year old female with history of of myalgia chronic headaches presents with acute onset of weakness which began today at work. States that she has some nausea and then a mild headache. She then developed symmetric weakness in arms or legs. She did have one episode of emesis. Denies any neck pain or photophobia. Does have some left-sided headache. Denies any fever or chills. Does have a history of CVA but states that this is different. Denies any visual changes. Her symptoms are worse when she tries to move around. Nothing makes them better. No treatment used prior to arrival.      Past Medical History:  Diagnosis Date  . Anxiety, mild   . Arthritis   . Bulging disc    noncompressive  . Chronic headaches   . Depression with anxiety 01/04/2013  . Headache(784.0) 01/04/2013  . Hearing loss    left ear  . Insomnia 01/04/2013  . Myalgia and myositis, unspecified 01/04/2013  . Obesity   . Seizures (HCC)   . Stroke (HCC)   . Tremor 01/04/2013    Patient Active Problem List   Diagnosis Date Noted  . Lobar pneumonia due to unspecified organism 08/11/2015  . Sepsis (HCC) 08/11/2015  . H/O: CVA (cerebrovascular accident) 08/11/2015  . Prolonged QT interval 08/11/2015  . Myalgia and myositis, unspecified 01/04/2013  . Headache(784.0) 01/04/2013  . Depression with anxiety 01/04/2013  . Insomnia 01/04/2013  . Disturbance of skin sensation 01/04/2013  . Tremor 01/04/2013    Past Surgical History:  Procedure Laterality Date  . ANKLE SURGERY     Left, after surgery  . FRACTURE SURGERY    . TUBAL LIGATION      OB History    No data available       Home Medications    Prior to Admission medications   Medication Sig Start Date End Date Taking?  Authorizing Provider  cyanocobalamin 500 MCG tablet Take 500 mcg by mouth daily.   Yes Historical Provider, MD  HYDROcodone-acetaminophen (NORCO) 7.5-325 MG per tablet Take 1 tablet by mouth 2 (two) times daily as needed for moderate pain.  12/26/12  Yes Historical Provider, MD  magnesium gluconate (MAGONATE) 500 MG tablet Take 500 mg by mouth daily.   Yes Historical Provider, MD  Multiple Vitamin (MULTIVITAMIN WITH MINERALS) TABS Take 1 tablet by mouth daily.   Yes Historical Provider, MD  phentermine (ADIPEX-P) 37.5 MG tablet Take 37.5 mg by mouth daily. 04/01/16  Yes Historical Provider, MD    Family History Family History  Problem Relation Age of Onset  . Cancer Mother     breast cancer  . Hypertension Mother   . COPD Father   . Heart disease Father   . Cancer Sister   . Cancer Maternal Grandmother   . Cancer Maternal Grandfather   . Neurofibromatosis Maternal Aunt     Social History Social History  Substance Use Topics  . Smoking status: Never Smoker  . Smokeless tobacco: Never Used  . Alcohol use No     Allergies   Lyrica [pregabalin]   Review of Systems Review of Systems  All other systems reviewed and are negative.    Physical Exam Updated Vital Signs BP (!) 134/95   Pulse 87  Temp 97.9 F (36.6 C) (Oral)   Resp 11   Ht  (1.702 m)   Wt 102.1 kg   LMP 04/13/2016   SpO2 100%   BMI 35.24 kg/m   Physical Exam  Constitutional: She is oriented to person, place, and time. She appears well-developed and well-nourished.  Non-toxic appearance. No distress.  HENT:  Head: Normocephalic and atraumatic.  Eyes: Conjunctivae, EOM and lids are normal. Pupils are equal, round, and reactive to light.  Neck: Normal range of motion. Neck supple. No tracheal deviation present. No thyroid mass present.  Cardiovascular: Normal rate, regular rhythm and normal heart sounds.  Exam reveals no gallop.   No murmur heard. Pulmonary/Chest: Effort normal and breath sounds  normal. No stridor. No respiratory distress. She has no decreased breath sounds. She has no wheezes. She has no rhonchi. She has no rales.  Abdominal: Soft. Normal appearance and bowel sounds are normal. She exhibits no distension. There is no tenderness. There is no rebound and no CVA tenderness.  Musculoskeletal: Normal range of motion. She exhibits no edema or tenderness.  Neurological: She is alert and oriented to person, place, and time. She has normal strength. She displays no atrophy and no tremor. No cranial nerve deficit or sensory deficit. Coordination normal. GCS eye subscore is 4. GCS verbal subscore is 5. GCS motor subscore is 6.  Exam limited by patient's effort  Skin: Skin is warm and dry. No abrasion and no rash noted.  Psychiatric: She has a normal mood and affect. Her speech is normal and behavior is normal.  Nursing note and vitals reviewed.    ED Treatments / Results  Labs (all labs ordered are listed, but only abnormal results are displayed) Labs Reviewed  BASIC METABOLIC PANEL - Abnormal; Notable for the following:       Result Value   Glucose, Bld 108 (*)    All other components within normal limits  CBC  URINALYSIS, ROUTINE W REFLEX MICROSCOPIC  CBG MONITORING, ED    EKG  EKG Interpretation  Date/Time:  Tuesday April 27 2016 20:51:32 EDT Ventricular Rate:  92 PR Interval:    QRS Duration: 104 QT Interval:  365 QTC Calculation: 452 R Axis:   100 Text Interpretation:  Sinus rhythm Right axis deviation Low voltage, precordial leads Borderline T abnormalities, anterior leads No significant change since last tracing Confirmed by Lakresha Stifter  MD, Malayla Granberry ([id number]) on 04/27/2016 10:16:13 PM       Radiology No results found.  Procedures Procedures (including critical care time)  Medications Ordered in ED Medications  0.9 %  sodium chloride infusion (not administered)  sodium chloride 0.9 % bolus 2,000 mL (not administered)  metoCLOPramide (REGLAN) injection 10  mg (not administered)  diphenhydrAMINE (BENADRYL) injection 12.5 mg (not administered)  LORazepam (ATIVAN) injection 0.5 mg (not administered)     Initial Impression / Assessment and Plan / ED Course  I have reviewed the triage vital signs and the nursing notes.  Pertinent labs & imaging results that were available during my care of the patient were reviewed by me and considered in my medical decision making (see chart for details).     Patient feels better after a medicated here. She has no focal deficits. Will be given neurology referral.  Final Clinical Impressions(s) / ED Diagnoses   Final diagnoses:  None    New Prescriptions New Prescriptions   No medications on file     Lorre Nick, MD 04/27/16 2340

## 2016-04-27 NOTE — ED Triage Notes (Signed)
Pt states that she started to feel weak and disoriented at work today, she vomited her breakfast and then this evening her legs and arms and hands became numb and tingly, pt states she feels confused at this time

## 2016-04-27 NOTE — ED Notes (Signed)
Pt states having a history of having a stroke in 2010

## 2016-04-27 NOTE — ED Notes (Addendum)
Pt. States having nausea, vomiting, and headache behind left eye along with weakness and tingling in arms and legs. Pt is also having blurred vision peripherally in both eyes.

## 2016-04-27 NOTE — ED Notes (Signed)
Patient transported to CT 

## 2016-09-02 ENCOUNTER — Encounter (HOSPITAL_BASED_OUTPATIENT_CLINIC_OR_DEPARTMENT_OTHER): Payer: Self-pay | Admitting: *Deleted

## 2016-09-02 ENCOUNTER — Emergency Department (HOSPITAL_BASED_OUTPATIENT_CLINIC_OR_DEPARTMENT_OTHER)
Admission: EM | Admit: 2016-09-02 | Discharge: 2016-09-02 | Disposition: A | Discharge status: Home / Self Care | Payer: Self-pay | Attending: Emergency Medicine | Admitting: Emergency Medicine

## 2016-09-02 ENCOUNTER — Emergency Department (HOSPITAL_BASED_OUTPATIENT_CLINIC_OR_DEPARTMENT_OTHER): Payer: Self-pay

## 2016-09-02 DIAGNOSIS — M25571 Pain in right ankle and joints of right foot: Secondary | ICD-10-CM | POA: Insufficient documentation

## 2016-09-02 DIAGNOSIS — Z79899 Other long term (current) drug therapy: Secondary | ICD-10-CM | POA: Insufficient documentation

## 2016-09-02 MED ORDER — KETOROLAC TROMETHAMINE 30 MG/ML IJ SOLN
15.0000 mg | Freq: Once | INTRAMUSCULAR | Status: AC
  Administered 2016-09-02: 15 mg via INTRAMUSCULAR
  Filled 2016-09-02: qty 1

## 2016-09-02 NOTE — ED Notes (Signed)
ED Provider at bedside. 

## 2016-09-02 NOTE — Discharge Instructions (Signed)
Please read instructions below. You can take aleve every 12 hours for pain. Apply ice to your ankle for 20 minutes at a time. Elevate it when possible. Schedule an appointment with the orthopedic specialist in 1 week if symptoms persist.  Return to the ER for fever, redness, worsening pain, or new or concerning symptoms.

## 2016-09-02 NOTE — ED Provider Notes (Signed)
[facility] Provider Note   CSN: [id number] Arrival date & time: 09/02/16  2136     History   Chief Complaint Chief Complaint  Patient presents with  . Ankle Pain    HPI Heather Simon is a 46 y.o. female s/p ORIF in 1999 on right ankle, presents with acute onset of right ankle pain x5days. Pt states pain is achy, located lateral aspect of ankle and worse with weight bearing. No recent injuries or strenuous activity. Not improved with advil. Denies F, dec ROM, N/T, or any other complaints today.  The history is provided by the patient.    Past Medical History:  Diagnosis Date  . Anxiety, mild   . Arthritis   . Bulging disc    noncompressive  . Chronic headaches   . Depression with anxiety 01/04/2013  . Headache(784.0) 01/04/2013  . Hearing loss    left ear  . Insomnia 01/04/2013  . Myalgia and myositis, unspecified 01/04/2013  . Obesity   . Seizures (HCC)   . Stroke (HCC)   . Tremor 01/04/2013    Patient Active Problem List   Diagnosis Date Noted  . Lobar pneumonia due to unspecified organism 08/11/2015  . Sepsis (HCC) 08/11/2015  . H/O: CVA (cerebrovascular accident) 08/11/2015  . Prolonged QT interval 08/11/2015  . Myalgia and myositis, unspecified 01/04/2013  . Headache(784.0) 01/04/2013  . Depression with anxiety 01/04/2013  . Insomnia 01/04/2013  . Disturbance of skin sensation 01/04/2013  . Tremor 01/04/2013    Past Surgical History:  Procedure Laterality Date  . ANKLE SURGERY     Left, after surgery  . FRACTURE SURGERY    . TUBAL LIGATION      OB History    No data available       Home Medications    Prior to Admission medications   Medication Sig Start Date End Date Taking? Authorizing Provider  cyanocobalamin 500 MCG tablet Take 500 mcg by mouth daily.    [provider]  HYDROcodone-acetaminophen (NORCO) 7.5-325 MG per tablet Take 1 tablet by mouth 2 (two) times daily as needed for moderate pain.  12/26/12    [provider]  magnesium gluconate (MAGONATE) 500 MG tablet Take 500 mg by mouth daily.    [provider]  Multiple Vitamin (MULTIVITAMIN WITH MINERALS) TABS Take 1 tablet by mouth daily.    [provider]  phentermine (ADIPEX-P) 37.5 MG tablet Take 37.5 mg by mouth daily. 04/01/16   [provider]    Family History Family History  Problem Relation Age of Onset  . Cancer Mother        breast cancer  . Hypertension Mother   . COPD Father   . Heart disease Father   . Cancer Sister   . Cancer Maternal Grandmother   . Cancer Maternal Grandfather   . Neurofibromatosis Maternal Aunt     Social History Social History  Substance Use Topics  . Smoking status: Never Smoker  . Smokeless tobacco: Never Used  . Alcohol use No     Allergies   Lyrica [pregabalin]   Review of Systems Review of Systems  Constitutional: Negative for fever.  Musculoskeletal: Positive for arthralgias (right ankle) and joint swelling.  Skin: Negative for color change and wound.  Neurological: Negative for numbness.     Physical Exam Updated Vital Signs BP (!) 133/93   Pulse 76   Temp 98.6 F (37 C) (Oral)   Resp 17   Ht 5\' 7"  (1.702  m)   Wt 97.5 kg (215 lb)   LMP 09/02/2016   SpO2 99%   BMI 33.67 kg/m   Physical Exam  Constitutional: She appears well-developed and well-nourished. No distress.  HENT:  Head: Normocephalic and atraumatic.  Eyes: Conjunctivae are normal.  Cardiovascular: Normal rate and intact distal pulses.   Pulmonary/Chest: Effort normal.  Musculoskeletal:  Right ankle with tenderness over lateral malleolus and anterolateral aspect of ankle. Normal active and passive range of motion of the ankle. No ecchymosis, edema or gross deformities. Ankle is stable  Neurological:  Normal sensation distal foot  Skin: Skin is warm.  Psychiatric: She has a normal mood and affect. Her behavior is normal.  Nursing note and vitals  reviewed.    ED Treatments / Results  Labs (all labs ordered are listed, but only abnormal results are displayed) Labs Reviewed - No data to display  EKG  EKG Interpretation None       Radiology Dg Ankle Complete Right  Result Date: 09/02/2016 CLINICAL DATA:  Right ankle pain and prior ankle ORIF in 1999. EXAM: RIGHT ANKLE - COMPLETE 3+ VIEW COMPARISON:  None. FINDINGS: Status post mediolateral ORIF with lateral fixation plate cul by several screws and 2 separate screws across the medial malleolus. No evidence of acute fracture, hardware failure or abnormal lucency surrounding hardware. Mild degenerative disease is seen involving the ankle mortise. No bony lesions or destruction. Soft tissues are unremarkable. IMPRESSION: Unremarkable appearance of prior ORIF of the right ankle. 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 Electronically Signed   By: Irish Lack M.D.   On: 09/02/2016 22:08    Procedures Procedures (including critical care time)  Medications Ordered in ED Medications  ketorolac (TORADOL) 30 MG/ML injection 15 mg (15 mg Intramuscular Given 09/02/16 2307)     Initial Impression / Assessment and Plan / ED Course  I have reviewed the triage vital signs and the nursing notes.  Pertinent labs & imaging results that were available during my care of the patient were reviewed by me and considered in my medical decision making (see chart for details).     Patient with acute onset of right ankle pain status post ORIF in 1999. No recent injuries. NV intact. Ankle is stable. X-Ray negative for obvious fracture or dislocation, hardware in place. Pain managed in ED. Pt advised to follow up with orthopedics if symptoms persist. Patient given brace while in ED, conservative therapy recommended and discussed. Patient will be dc home & is agreeable with above plan.  Discussed results, findings, treatment and follow up. Patient advised of return  precautions. Patient verbalized understanding and agreed with plan.   Final Clinical Impressions(s) / ED Diagnoses   Final diagnoses:  Acute right ankle pain    New Prescriptions Discharge Medication List as of 09/02/2016 11:00 PM       Russo, [place] N, PA-C 09/03/16 0113    Tilden Fossa, MD 09/03/16 [phone]

## 2016-09-02 NOTE — ED Triage Notes (Signed)
Pain in her right ankle x 5 days. States she has a hx of broken ankle with pins in 1999 and she feels something is not right with the surgery.

## 2017-08-21 IMAGING — CR DG CHEST 2V
2 series · 2 of 2 positions shown · non-contrast
Comparison: Prior CT from 06/01/2012.

CLINICAL DATA: Initial evaluation for acute central chest pain.

EXAM:
CHEST  2 VIEW

[w chest pa]
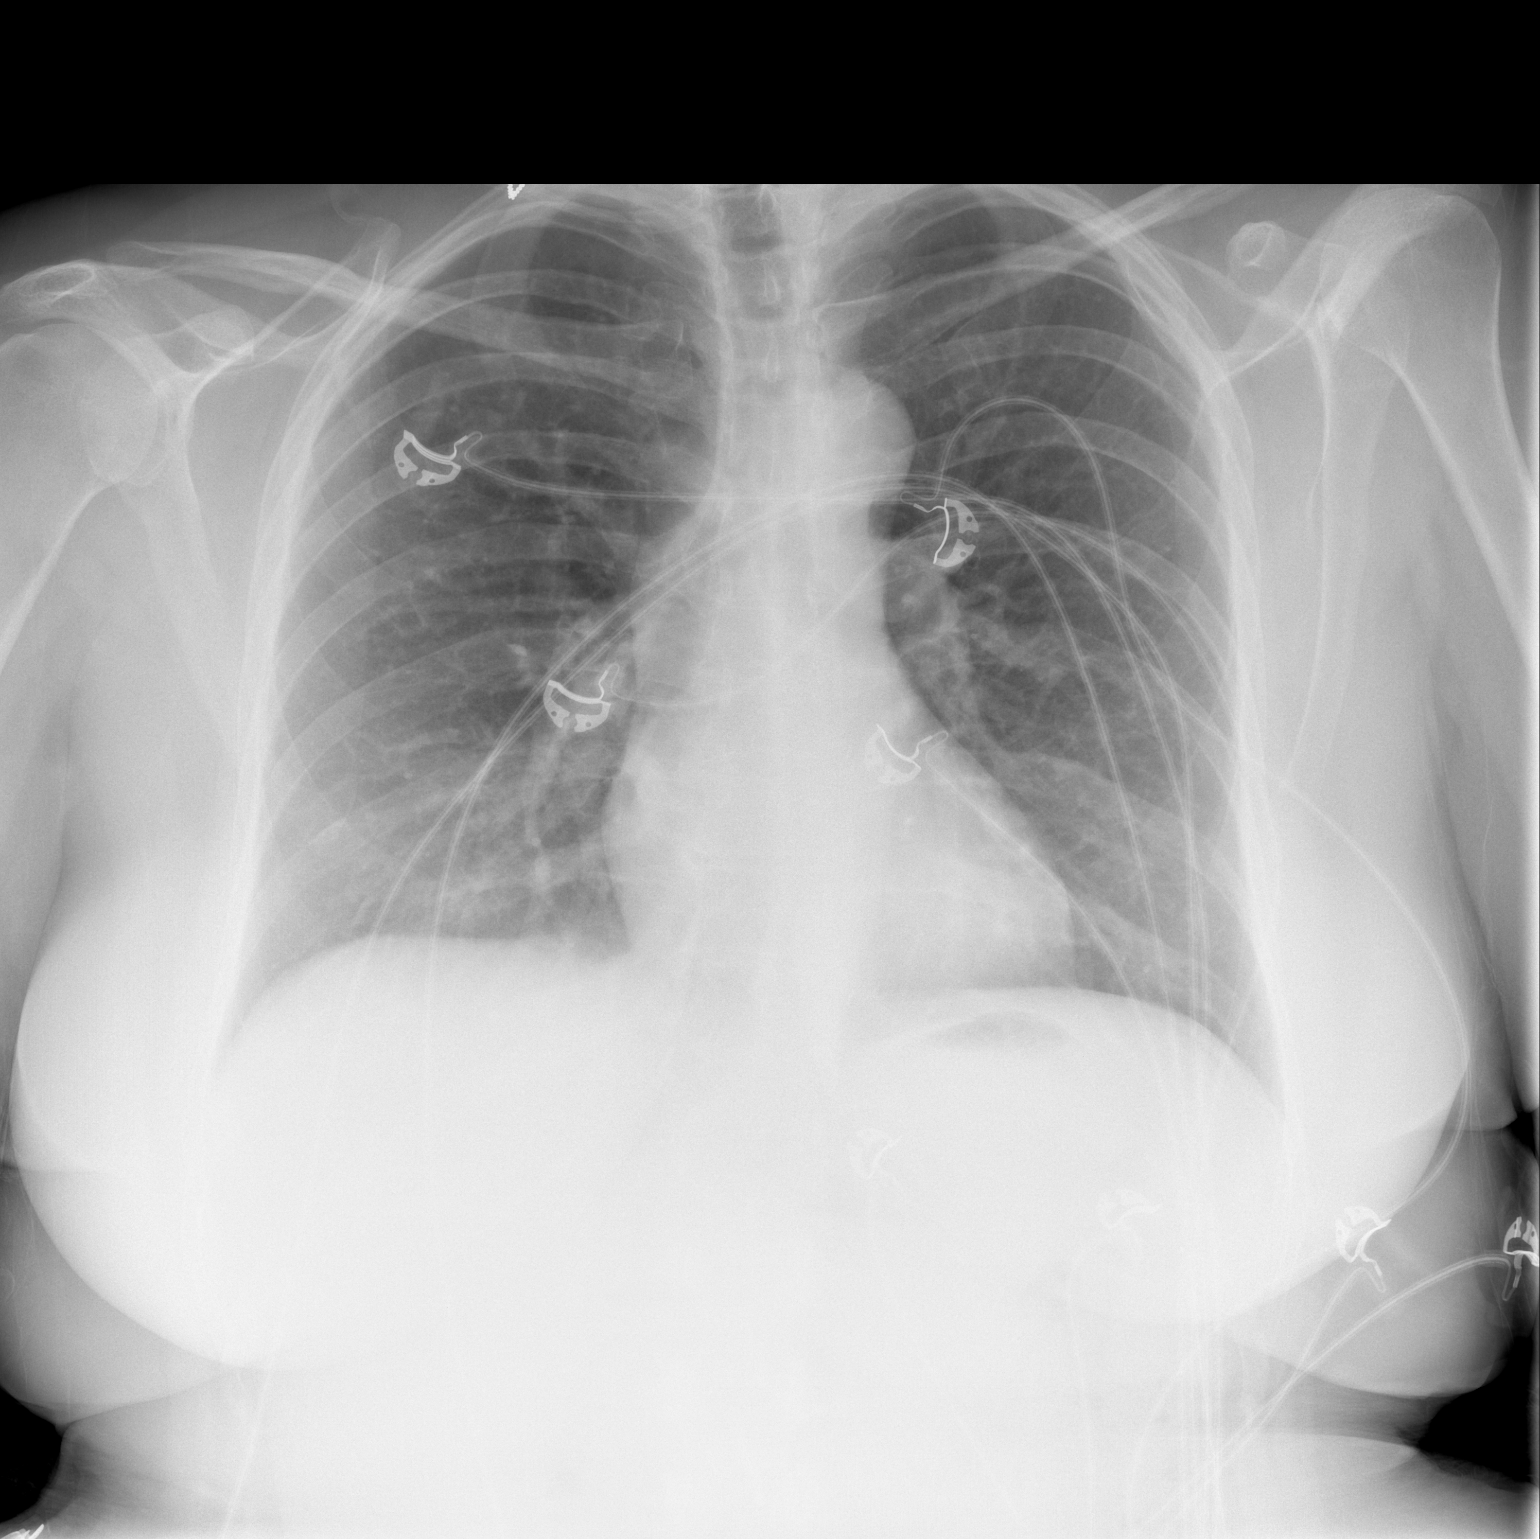

[w chest lat]
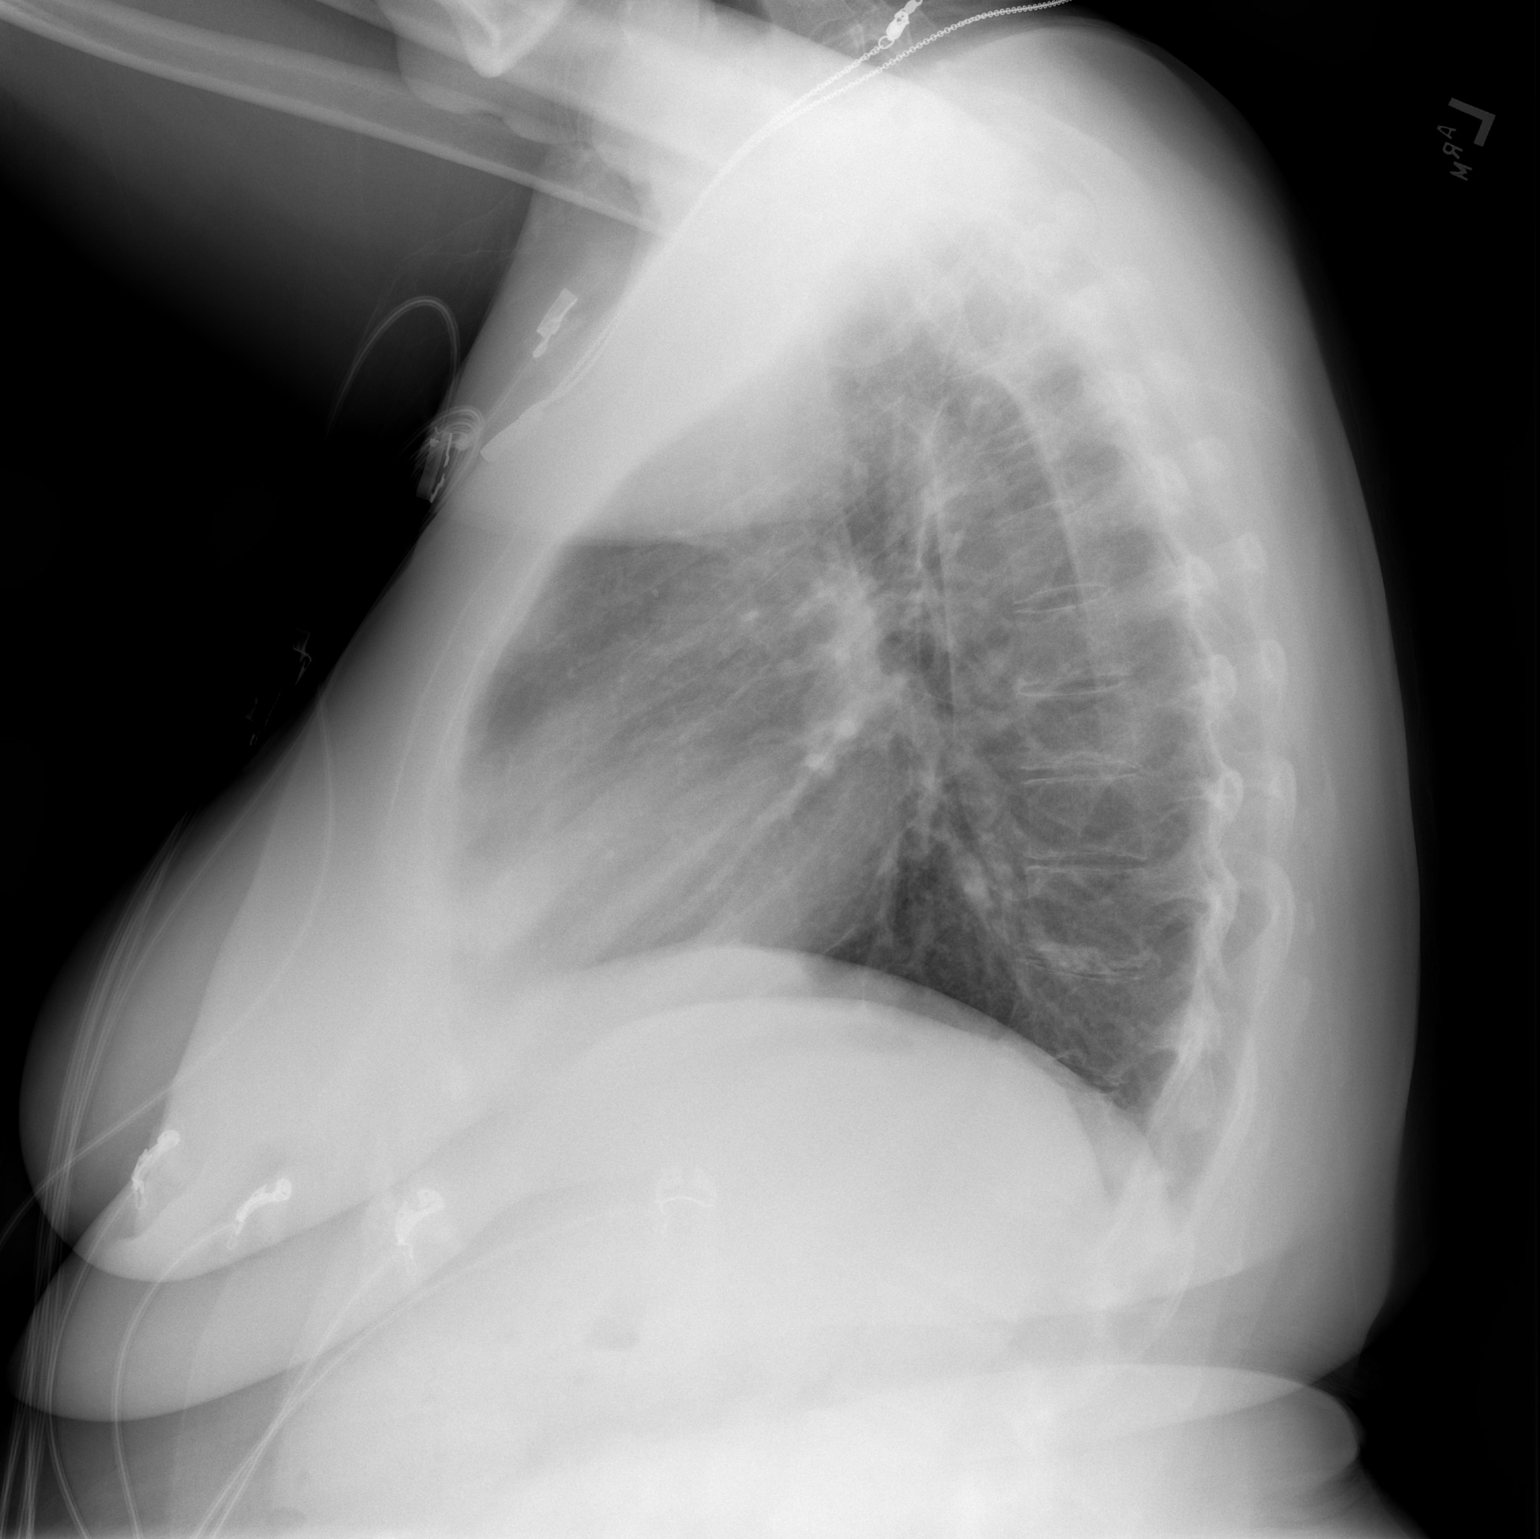

[2 of 2 positions shown; findings below may reference images not displayed]

FINDINGS: The cardiac and mediastinal silhouettes are stable in size and
contour, and remain within normal limits.

The lungs are normally inflated. No airspace consolidation, pleural
effusion, or pulmonary edema is identified. There is no
pneumothorax.

No acute osseous abnormality identified.
IMPRESSION: No active cardiopulmonary disease.

## 2019-01-03 ENCOUNTER — Other Ambulatory Visit: Payer: Self-pay

## 2019-01-03 DIAGNOSIS — Z20822 Contact with and (suspected) exposure to covid-19: Secondary | ICD-10-CM

## 2019-01-05 LAB — NOVEL CORONAVIRUS, NAA: SARS-CoV-2, NAA: NOT DETECTED

## 2020-09-06 ENCOUNTER — Encounter (HOSPITAL_BASED_OUTPATIENT_CLINIC_OR_DEPARTMENT_OTHER): Payer: Self-pay

## 2020-09-06 ENCOUNTER — Other Ambulatory Visit: Payer: Self-pay

## 2020-09-06 ENCOUNTER — Emergency Department (HOSPITAL_BASED_OUTPATIENT_CLINIC_OR_DEPARTMENT_OTHER)
Admission: EM | Admit: 2020-09-06 | Discharge: 2020-09-06 | Disposition: A | Discharge status: Home / Self Care | Payer: BC Managed Care – PPO | Attending: Emergency Medicine | Admitting: Emergency Medicine

## 2020-09-06 ENCOUNTER — Emergency Department (HOSPITAL_BASED_OUTPATIENT_CLINIC_OR_DEPARTMENT_OTHER): Payer: BC Managed Care – PPO

## 2020-09-06 DIAGNOSIS — T148XXA Other injury of unspecified body region, initial encounter: Secondary | ICD-10-CM

## 2020-09-06 DIAGNOSIS — W010XXA Fall on same level from slipping, tripping and stumbling without subsequent striking against object, initial encounter: Secondary | ICD-10-CM | POA: Diagnosis not present

## 2020-09-06 DIAGNOSIS — S8991XA Unspecified injury of right lower leg, initial encounter: Secondary | ICD-10-CM | POA: Diagnosis present

## 2020-09-06 DIAGNOSIS — Y92002 Bathroom of unspecified non-institutional (private) residence single-family (private) house as the place of occurrence of the external cause: Secondary | ICD-10-CM | POA: Insufficient documentation

## 2020-09-06 DIAGNOSIS — S8011XA Contusion of right lower leg, initial encounter: Secondary | ICD-10-CM

## 2020-09-06 MED ORDER — IBUPROFEN 600 MG PO TABS: 600.0000 mg | ORAL_TABLET | Freq: Three times a day (TID) | ORAL | 0 refills | Status: AC | PRN

## 2020-09-06 NOTE — Discharge Instructions (Addendum)
X-rays do not reveal any evidence of fracture.  You likely have hematoma and contusion, symptoms will improve with time.  Ice and heat therapy should help along with ibuprofen every 6-8 hours for the next 3 days.

## 2020-09-06 NOTE — ED Triage Notes (Signed)
She states that just over a week ago she tripped in her bathroom, thereby striking her right shin area on her tub. She has an ecchymotic area there. She is here today with the concern that she is having some paresthesias of her right great and second toes. She is able to ambulate. She tells me that his injury occurred at an old fracture site.

## 2020-09-06 NOTE — ED Provider Notes (Signed)
[facility] EMERGENCY DEPT Provider Note   CSN: [id number] Arrival date & time: 09/06/20  1024     History Chief Complaint  Patient presents with   Leg Injury    Heather Simon is a 50 y.o. female.  HPI     50 year old female comes in with chief complaint of right leg pain.  About 9 days ago she had a mechanical fall that led to her right leg striking the corner of the bathtub.  After the blunt trauma, she did not seek any treatment.  She over time has noticed increased bruising, pain and she has noted intermittent swelling to the right lower extremity.  Swelling is worse at the end of the day.  Pain continues to be present over the mid shin region, where there is a diffuse bruise, and it is worse with certain activities and movement.  She is concerned that she might have had a broken bone.  She has suffered a fracture to that leg previously.  Past Medical History:  Diagnosis Date   Anxiety, mild    Arthritis    Bulging disc    noncompressive   Chronic headaches    Depression with anxiety 01/04/2013   Headache(784.0) 01/04/2013   Hearing loss    left ear   Insomnia 01/04/2013   Myalgia and myositis, unspecified 01/04/2013   Obesity    Seizures (HCC)    Stroke (HCC)    Tremor 01/04/2013    Patient Active Problem List   Diagnosis Date Noted   Lobar pneumonia due to unspecified organism 08/11/2015   Sepsis (HCC) 08/11/2015   H/O: CVA (cerebrovascular accident) 08/11/2015   Prolonged QT interval 08/11/2015   Myalgia and myositis, unspecified 01/04/2013   Headache(784.0) 01/04/2013   Depression with anxiety 01/04/2013   Insomnia 01/04/2013   Disturbance of skin sensation 01/04/2013   Tremor 01/04/2013    Past Surgical History:  Procedure Laterality Date   ANKLE SURGERY     Left, after surgery   FRACTURE SURGERY     TUBAL LIGATION       OB History   No obstetric history on file.     Family History  Problem Relation Age of Onset   Cancer  Mother        breast cancer   Hypertension Mother    COPD Father    Heart disease Father    Cancer Sister    Cancer Maternal Grandmother    Cancer Maternal Grandfather    Neurofibromatosis Maternal Aunt     Social History   Tobacco Use   Smoking status: Never   Smokeless tobacco: Never  Substance Use Topics   Alcohol use: No   Drug use: No    Home Medications Prior to Admission medications   Medication Sig Start Date End Date Taking? Authorizing Provider  cyanocobalamin 500 MCG tablet Take 500 mcg by mouth daily.    [provider]  HYDROcodone-acetaminophen (NORCO) 7.5-325 MG per tablet Take 1 tablet by mouth 2 (two) times daily as needed for moderate pain.  12/26/12   [provider]  magnesium gluconate (MAGONATE) 500 MG tablet Take 500 mg by mouth daily.    [provider]  Multiple Vitamin (MULTIVITAMIN WITH MINERALS) TABS Take 1 tablet by mouth daily.    [provider]  phentermine (ADIPEX-P) 37.5 MG tablet Take 37.5 mg by mouth daily. 04/01/16   [provider]    Allergies    Lyrica [pregabalin]  Review of Systems   Review  of Systems  Constitutional:  Positive for activity change.  Musculoskeletal:  Positive for myalgias.  Skin:  Positive for wound.  Neurological:  Positive for numbness.  Hematological:  Does not bruise/bleed easily.   Physical Exam Updated Vital Signs BP 134/87 (BP Location: Right Arm)   Pulse 94   Temp 98.3 F (36.8 C) (Oral)   Resp 18   LMP  (LMP Unknown)   SpO2 100%   Physical Exam Vitals and nursing note reviewed.  Constitutional:      Appearance: She is well-developed.  HENT:     Head: Atraumatic.  Cardiovascular:     Rate and Rhythm: Normal rate.  Pulmonary:     Effort: Pulmonary effort is normal.  Musculoskeletal:        General: Swelling and tenderness present. No deformity.     Cervical back: Normal range of motion and neck supple.  Skin:    General: Skin is warm and dry.      Findings: Bruising present.     Comments: Bruising to the right mid shin and also around the lateral ankle  Neurological:     Mental Status: She is alert and oriented to person, place, and time.    ED Results / Procedures / Treatments   Labs (all labs ordered are listed, but only abnormal results are displayed) Labs Reviewed - No data to display  EKG None  Radiology DG Tibia/Fibula Right  Result Date: 09/06/2020 CLINICAL DATA:  50 year old female status post fall 9 days ago with continued pain. EXAM: RIGHT TIBIA AND FIBULA - 2 VIEW COMPARISON:  Right ankle series 09/02/2016. FINDINGS: Chronic distal tibia and fibula ORIF hardware appears stable and intact. Preserved alignment at the right knee and ankle. Underlying normal bone mineralization. No ankle joint effusion. No acute osseous abnormality identified. Visible calcaneus intact. There is joint space loss and degenerative spurring at the talonavicular joint. No discrete soft tissue injury. IMPRESSION: No acute fracture or dislocation identified about the right tib-fib. Electronically Signed   By: Odessa Fleming M.D.   On: 09/06/2020 11:47    Procedures Procedures   Medications Ordered in ED Medications - No data to display  ED Course  I have reviewed the triage vital signs and the nursing notes.  Pertinent labs & imaging results that were available during my care of the patient were reviewed by me and considered in my medical decision making (see chart for details).    MDM Rules/Calculators/A&P                           50 year old female comes in with chief complaint of pain and swelling of her right leg.  She is noted to have ecchymosis at the site of the blunt trauma.  X-rays ordered to make sure there was no healing fracture.  Bruise spreading because of gravity.    Final Clinical Impression(s) / ED Diagnoses Final diagnoses:  Contusion of right tibia  Hematoma    Rx / DC Orders ED Discharge Orders     None         Derwood Kaplan, MD 09/06/20 1156

## 2020-10-31 ENCOUNTER — Other Ambulatory Visit: Payer: Self-pay

## 2020-10-31 ENCOUNTER — Ambulatory Visit: Payer: BC Managed Care – PPO | Admitting: Cardiology

## 2020-10-31 ENCOUNTER — Encounter: Payer: Self-pay | Admitting: Cardiology

## 2020-10-31 VITALS — BP 127/87 | HR 89 | Temp 98.4°F | Resp 17 | Ht 67.0 in | Wt 248.2 lb

## 2020-10-31 DIAGNOSIS — E6609 Other obesity due to excess calories: Secondary | ICD-10-CM

## 2020-10-31 DIAGNOSIS — R072 Precordial pain: Secondary | ICD-10-CM

## 2020-10-31 DIAGNOSIS — Z8673 Personal history of transient ischemic attack (TIA), and cerebral infarction without residual deficits: Secondary | ICD-10-CM

## 2020-10-31 MED ORDER — NITROGLYCERIN 0.4 MG SL SUBL: 0.4000 mg | SUBLINGUAL_TABLET | SUBLINGUAL | 0 refills | Status: AC | PRN

## 2020-10-31 NOTE — Progress Notes (Signed)
Date:  10/31/2020   ID:  Jason Nest, DOB May 10, 1970, MRN [id number]  PCP:  Diamantina Providence, FNP  Cardiologist:  Tessa Lerner, DO, [facility] (established care 10/31/2020)  REASON FOR CONSULT: Tachycardia  REQUESTING PHYSICIAN:  Diamantina Providence, FNP [address] [place],  [place] [address]  Chief Complaint  Patient presents with   New Patient (Initial Visit)   Tachycardia    HPI  Heather Simon is a 50 y.o. female who presents to the office with a chief complaint of " tachycardia." Patient's past medical history and cardiovascular risk factors include: History of stroke (2010), seizures, headache, Obesity due to excess calories.   She is referred to the office at the request of Diamantina Providence, FNP for evaluation of Tachycardia.  Patient is here accompanied by her husband Heather Simon.  Approximately 2 weeks ago she woke up in the morning experiencing chest discomfort predominately located over the left breast, tightness like sensation, intensity 8 out of 10, lasting for about 2 hours, nonradiating, associated with nausea and diaphoresis, and self-limited.  Patient states that she went back to sleep.  When she woke up she continued to have chest discomfort but the intensity was lighter and she tried to do her activities of daily living.  However she took a slow for the following 2 days and then followed up with PCP as the symptoms have not resolved.  Patient states that her PCP had ordered a chest x-ray, EKG, and started her on Lopressor and referred to cardiology for further evaluation and management.  Patient is not having any active chest pain as we speak during today's encounter.  However she states that if she were to [facility] herself she would experience chest discomfort and it can also improved with resting.  For reasons unknown she has not gone to the hospital sooner for further evaluation and management.  Has been started on a baby aspirin and Lopressor both which  have improved her symptoms.  FUNCTIONAL STATUS: No structured exercise program or daily routine.   ALLERGIES: Allergies  Allergen Reactions   Lyrica [Pregabalin] Other (See Comments)    Altered mental state/ weight gain     MEDICATION LIST PRIOR TO VISIT: Current Meds  Medication Sig   ASPIRIN LOW DOSE 81 MG EC tablet Take 81 mg by mouth daily.   cyclobenzaprine (FLEXERIL) 10 MG tablet Take 10 mg by mouth 2 (two) times daily as needed.   esomeprazole (NEXIUM) 40 MG capsule Take 40 mg by mouth daily.   HYDROcodone-acetaminophen (NORCO) 7.5-325 MG per tablet Take 1 tablet by mouth 2 (two) times daily as needed for moderate pain.    ibuprofen (ADVIL) 600 MG tablet Take 1 tablet (600 mg total) by mouth every 8 (eight) hours as needed.   metoprolol tartrate (LOPRESSOR) 25 MG tablet Take 25 mg by mouth daily.   Multiple Vitamin (MULTIVITAMIN WITH MINERALS) TABS Take 1 tablet by mouth daily.     PAST MEDICAL HISTORY: Past Medical History:  Diagnosis Date   Anxiety, mild    Arthritis    Bulging disc    noncompressive   Chronic headaches    Depression with anxiety 01/04/2013   Headache(784.0) 01/04/2013   Hearing loss    left ear   Insomnia 01/04/2013   Myalgia and myositis, unspecified 01/04/2013   Obesity    Seizures (HCC)    Stroke [facility])    Tremor 01/04/2013    PAST SURGICAL HISTORY: Past Surgical History:  Procedure  Laterality Date   ANKLE SURGERY     Left, after surgery   FRACTURE SURGERY     TUBAL LIGATION      FAMILY HISTORY: The patient family history includes COPD in her father; Cancer in her maternal grandfather, maternal grandmother, mother, and sister; Heart disease in her father; Hypertension in her mother; Neurofibromatosis in her maternal aunt.  SOCIAL HISTORY:  The patient  reports that she has never smoked. She has never used smokeless tobacco. She reports that she does not drink alcohol and does not use drugs.  REVIEW OF SYSTEMS: Review of Systems   Constitutional: Negative for chills and fever.  HENT:  Negative for hoarse voice and nosebleeds.   Eyes:  Negative for discharge, double vision and pain.  Cardiovascular:  Positive for chest pain and dyspnea on exertion. Negative for claudication, leg swelling, near-syncope, orthopnea, palpitations, paroxysmal nocturnal dyspnea and syncope.  Respiratory:  Positive for shortness of breath. Negative for hemoptysis.   Musculoskeletal:  Negative for muscle cramps and myalgias.  Gastrointestinal:  Negative for abdominal pain, constipation, diarrhea, hematemesis, hematochezia, melena, nausea and vomiting.  Neurological:  Positive for light-headedness. Negative for dizziness.  All other systems reviewed and are negative.  PHYSICAL EXAM: Vitals with BMI 10/31/2020 09/06/2020 09/06/2020  Height 5\' 7"  - -  Weight 248 lbs 3 oz - -  BMI 38.86 - -  Systolic 127 118  Diastolic 87 84 87  Pulse 89 86 94    CONSTITUTIONAL: Well-developed and well-nourished. No acute distress.  SKIN: Skin is warm and dry. No rash noted. No cyanosis. No pallor. No jaundice HEAD: Normocephalic and atraumatic.  EYES: No scleral icterus MOUTH/THROAT: Moist oral membranes.  NECK: No JVD present. No thyromegaly noted. No carotid bruits  LYMPHATIC: No visible cervical adenopathy.  CHEST Normal respiratory effort. No intercostal retractions  LUNGS: Clear to auscultation bilaterally.  No stridor. No wheezes. No rales.  CARDIOVASCULAR: Regular rate and rhythm, positive S1-S2, no murmurs rubs or gallops appreciated. ABDOMINAL: Obese, soft, nontender, nondistended, positive bowel sounds in all 4 quadrants, no apparent ascites.  EXTREMITIES: No peripheral edema  HEMATOLOGIC: No significant bruising NEUROLOGIC: Oriented to person, place, and time. Nonfocal. Normal muscle tone.  PSYCHIATRIC: Normal mood and affect. Normal behavior. Cooperative  CARDIAC DATABASE: EKG: 10/31/2020: Normal sinus rhythm, 86 bpm, normal axis,  without underlying ischemia or injury pattern.  Echocardiogram: No results found for this or any previous visit from the past 1095 days.   Stress Testing: No results found for this or any previous visit from the past 1095 days.  Heart Catheterization: None  LABORATORY DATA: CBC Latest Ref Rng & Units 04/27/2016 08/11/2015 07/12/2015  WBC 4.0 - 10.5 K/uL 9.0 9.0 6.0  Hemoglobin 12.0 - 15.0 g/dL 07/14/2015 27.2 53.6  Hematocrit 36.0 - 46.0 % 41.9 40.8 39.6  Platelets 150 - 400 K/uL 239 162 214    CMP Latest Ref Rng & Units 04/27/2016 08/12/2015 08/11/2015  Glucose 65 - 99 mg/dL 08/13/2015) 034(V) 425(Z)  BUN 6 - 20 mg/dL 10 14 16   Creatinine 0.44 - 1.00 mg/dL 563(O 7.56  Sodium 135 - 145 mmol/L 138 135 134(L)  Potassium 3.5 - 5.1 mmol/L 3.8 4.2 3.9  Chloride 101 - 111 mmol/L 103 109 105  CO2 22 - 32 mmol/L 27 20(L) 22  Calcium 8.9 - 10.3 mg/dL 9.3 8.0(L) 9.6  Total Protein 6.0 - 8.3 g/dL - - -  Total Bilirubin 0.3 - 1.2 mg/dL - - -  Alkaline Phos 39 -  117 U/L - - -  AST 0 - 37 U/L - - -  ALT 0 - 35 U/L - - -    Lipid Panel  No results found for: CHOL, TRIG, HDL, CHOLHDL, VLDL, LDLCALC, LDLDIRECT, LABVLDL  No components found for: NTPROBNP No results for input(s): PROBNP in the last 8760 hours. No results for input(s): TSH in the last 8760 hours.  BMP No results for input(s): NA, K, CL, CO2, GLUCOSE, BUN, CREATININE, CALCIUM, GFRNONAA, GFRAA in the last 8760 hours.  HEMOGLOBIN A1C No results found for: HGBA1C, MPG  External Labs: Collected: 10/23/2020 provided by PCP. Sodium 137, potassium 4.5, chloride 100, bicarb 21, BUN 13, creatinine 0.8, AST 17, ALT 17, alkaline phosphatase 110. Hemoglobin 13.8 g/dL, hematocrit 69.4%. TSH 1.576  BNP 10.6  D-dimer 0.27 Total cholesterol 176, triglycerides 74, HDL 65, non-HDL 97, LDL 96  IMPRESSION:    ICD-10-CM   1. Precordial pain  R07.2 EKG 12-Lead    PCV ECHOCARDIOGRAM COMPLETE    CT CORONARY MORPH W/CTA COR W/SCORE W/CA W/CM &/OR  WO/CM    Basic metabolic panel    2. Hx of stroke  Z86.73     3. Class 2 obesity due to excess calories without serious comorbidity with body mass index (BMI) of 38.0 to 38.9 in adult  E66.09    Z68.38        RECOMMENDATIONS: DAYLIN EADS is a 50 y.o. female whose past medical history and cardiac risk factors include: History of stroke (2010), seizures, headache, Obesity due to excess calories.   Precordial pain ECG nonischemic Chest pain concerning for cardiac etiology Discussed undergoing nuclear stress test versus coronary CTA.  The shared decision was to proceed with coronary CTA to evaluate for obstructive CAD. Recent labs performed by PCP and summarized above. Currently on Lopressor 25 mg p.o. daily I have asked her to increase it to twice daily. Echocardiogram will be ordered to evaluate for structural heart disease and left ventricular systolic function. Patient and her husband are aware that if her symptoms increase in intensity, frequency, duration or have worsening typical discomfort they should go to the closest ER via EMS for further evaluation and management. Sublingual nitroglycerin tablets prescribed for as needed basis Continue aspirin 81 mg p.o. daily until the work-up is complete  Hx of stroke Educated importance of secondary prevention. Currently managed by primary care provider.  Class 2 obesity due to excess calories without serious comorbidity with body mass index (BMI) of 38.0 to 38.9 in adult Body mass index is 38.87 kg/m. I reviewed with the patient the importance of diet, regular physical activity/exercise, weight loss.   Patient is educated on increasing physical activity gradually as tolerated.  With the goal of moderate intensity exercise for 30 minutes a day 5 days a week.   FINAL MEDICATION LIST END OF ENCOUNTER: No orders of the defined types were placed in this encounter.   Medications Discontinued During This Encounter  Medication Reason    magnesium gluconate (MAGONATE) 500 MG tablet Error   cyanocobalamin 500 MCG tablet Error   phentermine (ADIPEX-P) 37.5 MG tablet Error     Current Outpatient Medications:    ASPIRIN LOW DOSE 81 MG EC tablet, Take 81 mg by mouth daily., Disp: , Rfl:    cyclobenzaprine (FLEXERIL) 10 MG tablet, Take 10 mg by mouth 2 (two) times daily as needed., Disp: , Rfl:    esomeprazole (NEXIUM) 40 MG capsule, Take 40 mg by mouth daily., Disp: , Rfl:  HYDROcodone-acetaminophen (NORCO) 7.5-325 MG per tablet, Take 1 tablet by mouth 2 (two) times daily as needed for moderate pain. , Disp: , Rfl:    ibuprofen (ADVIL) 600 MG tablet, Take 1 tablet (600 mg total) by mouth every 8 (eight) hours as needed., Disp: 15 tablet, Rfl: 0   metoprolol tartrate (LOPRESSOR) 25 MG tablet, Take 25 mg by mouth daily., Disp: , Rfl:    Multiple Vitamin (MULTIVITAMIN WITH MINERALS) TABS, Take 1 tablet by mouth daily., Disp: , Rfl:   Orders Placed This Encounter  Procedures   CT CORONARY MORPH W/CTA COR W/SCORE W/CA W/CM &/OR WO/CM   Basic metabolic panel   EKG 12-Lead   PCV ECHOCARDIOGRAM COMPLETE    There are no Patient Instructions on file for this visit.   --Continue cardiac medications as reconciled in final medication list. --Return in about 3 weeks (around 11/21/2020) for Follow up, Chest pain, Review test results. Or sooner if needed. --Continue follow-up with your primary care physician regarding the management of your other chronic comorbid conditions.  Patient's questions and concerns were addressed to her satisfaction. She voices understanding of the instructions provided during this encounter.   This note was created using a voice recognition software as a result there may be grammatical errors inadvertently enclosed that do not reflect the nature of this encounter. Every attempt is made to correct such errors.  Tessa Lerner, [place], [facility]  Pager: [phone] Office: [phone]

## 2020-11-06 ENCOUNTER — Other Ambulatory Visit: Payer: Self-pay

## 2020-11-06 ENCOUNTER — Ambulatory Visit: Payer: BC Managed Care – PPO

## 2020-11-06 DIAGNOSIS — R072 Precordial pain: Secondary | ICD-10-CM

## 2020-11-11 ENCOUNTER — Telehealth (HOSPITAL_COMMUNITY): Payer: Self-pay | Admitting: *Deleted

## 2020-11-11 NOTE — Telephone Encounter (Signed)
Reaching out to patient to offer assistance regarding upcoming cardiac imaging study; pt verbalizes understanding of appt date/time, parking situation and where to check in, pre-test NPO status and medications ordered, and verified current allergies; name and call back number provided for further questions should they arise  Larey Brick RN Navigator Cardiac Imaging [facility] [phone] office [phone] cell  Patient to take AM dose of 25mg  metoprolol tartrate and then will take evening dose of 25mg  metoprolol tartrate two hours prior to cardiac CT scan.

## 2020-11-12 ENCOUNTER — Other Ambulatory Visit: Payer: Self-pay

## 2020-11-12 ENCOUNTER — Ambulatory Visit (HOSPITAL_COMMUNITY)
Admission: RE | Admit: 2020-11-12 | Discharge: 2020-11-12 | Disposition: A | Discharge status: Home / Self Care | Payer: BC Managed Care – PPO | Source: Ambulatory Visit | Attending: Cardiology | Admitting: Cardiology

## 2020-11-12 DIAGNOSIS — R072 Precordial pain: Secondary | ICD-10-CM | POA: Insufficient documentation

## 2020-11-12 DIAGNOSIS — K449 Diaphragmatic hernia without obstruction or gangrene: Secondary | ICD-10-CM | POA: Insufficient documentation

## 2020-11-12 MED ORDER — METOPROLOL TARTRATE 5 MG/5ML IV SOLN
INTRAVENOUS | Status: AC
  Administered 2020-11-12: 10 mg via INTRAVENOUS
  Filled 2020-11-12: qty 20

## 2020-11-12 MED ORDER — METOPROLOL TARTRATE 5 MG/5ML IV SOLN
INTRAVENOUS | Status: AC
  Filled 2020-11-12: qty 5

## 2020-11-12 MED ORDER — NITROGLYCERIN 0.4 MG SL SUBL: 0.8000 mg | SUBLINGUAL_TABLET | Freq: Once | SUBLINGUAL | Status: DC

## 2020-11-12 MED ORDER — DILTIAZEM HCL 25 MG/5ML IV SOLN
INTRAVENOUS | Status: AC
  Administered 2020-11-12: 10 mg via INTRAVENOUS
  Filled 2020-11-12: qty 5

## 2020-11-12 MED ORDER — METOPROLOL TARTRATE 5 MG/5ML IV SOLN
10.0000 mg | INTRAVENOUS | Status: DC | PRN
  Administered 2020-11-12: 10 mg via INTRAVENOUS

## 2020-11-12 MED ORDER — NITROGLYCERIN 0.4 MG SL SUBL
SUBLINGUAL_TABLET | SUBLINGUAL | Status: AC
  Filled 2020-11-12: qty 2

## 2020-11-12 MED ORDER — DILTIAZEM HCL 25 MG/5ML IV SOLN
10.0000 mg | INTRAVENOUS | Status: DC | PRN
  Administered 2020-11-12: 10 mg via INTRAVENOUS

## 2020-11-12 MED ORDER — IOHEXOL 350 MG/ML SOLN
100.0000 mL | Freq: Once | INTRAVENOUS | Status: AC | PRN
  Administered 2020-11-12: 95 mL via INTRAVENOUS

## 2020-12-01 ENCOUNTER — Ambulatory Visit: Payer: BC Managed Care – PPO | Admitting: Cardiology

## 2020-12-01 ENCOUNTER — Encounter: Payer: Self-pay | Admitting: Cardiology

## 2020-12-01 ENCOUNTER — Other Ambulatory Visit: Payer: Self-pay

## 2020-12-01 VITALS — BP 120/81 | HR 90 | Resp 16 | Ht 67.0 in | Wt 252.0 lb

## 2020-12-01 DIAGNOSIS — E6609 Other obesity due to excess calories: Secondary | ICD-10-CM

## 2020-12-01 DIAGNOSIS — R072 Precordial pain: Secondary | ICD-10-CM

## 2020-12-01 DIAGNOSIS — Z6838 Body mass index (BMI) 38.0-38.9, adult: Secondary | ICD-10-CM

## 2020-12-01 DIAGNOSIS — Z8673 Personal history of transient ischemic attack (TIA), and cerebral infarction without residual deficits: Secondary | ICD-10-CM

## 2020-12-01 NOTE — Progress Notes (Signed)
Date:  12/01/2020   ID:  Heather Simon, DOB May 17, 1970, MRN [id number]  PCP:  Vonna Drafts, FNP  Cardiologist:  Rex Kras, DO, [facility] (established care 10/31/2020)  Date: 12/01/20 Last Office Visit: 10/31/2020  Chief Complaint  Patient presents with   Chest Pain   Results   Follow-up   HPI  Heather Simon is a 50 y.o. female who presents to the office with a chief complaint of " reevaluation of chest pain and discuss test results." Patient's past medical history and cardiovascular risk factors include: History of stroke (2010), seizures, headache, Obesity due to excess calories.   She is referred to the office at the request of Vonna Drafts, FNP for evaluation of Tachycardia.  During initial consultation she was more concerned about her prior episodes of chest pain and tachycardia.  She was having episodes of precordial chest pain which was suggestive of possible cardiac etiology.  She had followed up with PCP and was started on aspirin and metoprolol.  Both had helped improve her symptoms and was referred to cardiology for further evaluation and management.  Given her risk factors of a prior stroke and current symptoms shared decision was to proceed with an echocardiogram and coronary CTA to evaluate for obstructive CAD.  Results of the echo and coronary CTA reviewed with her in great detail and noted below for further reference.  Since last office visit, chest pain and palpitations have improved significantly since last office visit.  No hospitalizations or urgent care visits for cardiovascular symptoms.  Overall euvolemic and not in congestive heart failure.  FUNCTIONAL STATUS: No structured exercise program or daily routine.   ALLERGIES: Allergies  Allergen Reactions   Lyrica [Pregabalin] Other (See Comments)    Altered mental state/ weight gain     MEDICATION LIST PRIOR TO VISIT: Current Meds  Medication Sig   ASPIRIN LOW DOSE 81 MG EC tablet Take 81 mg by  mouth daily.   cyclobenzaprine (FLEXERIL) 10 MG tablet Take 10 mg by mouth 2 (two) times daily as needed.   esomeprazole (NEXIUM) 40 MG capsule Take 40 mg by mouth daily.   HYDROcodone-acetaminophen (NORCO) 7.5-325 MG per tablet Take 1 tablet by mouth 2 (two) times daily as needed for moderate pain.    ibuprofen (ADVIL) 600 MG tablet Take 1 tablet (600 mg total) by mouth every 8 (eight) hours as needed.   metoprolol tartrate (LOPRESSOR) 25 MG tablet Take 25 mg by mouth 2 (two) times daily.   Multiple Vitamin (MULTIVITAMIN WITH MINERALS) TABS Take 1 tablet by mouth daily.   nitroGLYCERIN (NITROSTAT) 0.4 MG SL tablet Place 1 tablet (0.4 mg total) under the tongue every 5 (five) minutes as needed for chest pain. If you require more than two tablets five minutes apart go to the nearest ER via EMS.     PAST MEDICAL HISTORY: Past Medical History:  Diagnosis Date   Anxiety, mild    Arthritis    Bulging disc    noncompressive   Chronic headaches    Depression with anxiety 01/04/2013   Headache(784.0) 01/04/2013   Hearing loss    left ear   Insomnia 01/04/2013   Myalgia and myositis, unspecified 01/04/2013   Obesity    Seizures ([place])    Stroke [facility])    Tremor 01/04/2013    PAST SURGICAL HISTORY: Past Surgical History:  Procedure Laterality Date   ANKLE SURGERY     Left, after surgery   FRACTURE SURGERY  TUBAL LIGATION      FAMILY HISTORY: The patient family history includes COPD in her father; Cancer in her maternal grandfather, maternal grandmother, mother, and sister; Heart disease in her father; Hypertension in her mother; Neurofibromatosis in her maternal aunt.  SOCIAL HISTORY:  The patient  reports that she has never smoked. She has never used smokeless tobacco. She reports that she does not currently use alcohol. She reports that she does not use drugs.  REVIEW OF SYSTEMS: Review of Systems  Constitutional: Negative for chills and fever.  HENT:  Negative for hoarse  voice and nosebleeds.   Eyes:  Negative for discharge, double vision and pain.  Cardiovascular:  Negative for chest pain, claudication, dyspnea on exertion, leg swelling, near-syncope, orthopnea, palpitations, paroxysmal nocturnal dyspnea and syncope.  Respiratory:  Negative for hemoptysis and shortness of breath.   Musculoskeletal:  Negative for muscle cramps and myalgias.  Gastrointestinal:  Negative for abdominal pain, constipation, diarrhea, hematemesis, hematochezia, melena, nausea and vomiting.  Neurological:  Positive for light-headedness (improved). Negative for dizziness.  All other systems reviewed and are negative.  PHYSICAL EXAM: Vitals with BMI 12/01/2020 11/12/2020 11/12/2020  Height 5\' 7"  - -  Weight 252 lbs - -  BMI 39.46 - -  Systolic 120 99 129  Diastolic 81 74 86  Pulse 90 - -    CONSTITUTIONAL: Well-developed and well-nourished. No acute distress.  SKIN: Skin is warm and dry. No rash noted. No cyanosis. No pallor. No jaundice HEAD: Normocephalic and atraumatic.  EYES: No scleral icterus MOUTH/THROAT: Moist oral membranes.  NECK: No JVD present. No thyromegaly noted. No carotid bruits  LYMPHATIC: No visible cervical adenopathy.  CHEST Normal respiratory effort. No intercostal retractions  LUNGS: Clear to auscultation bilaterally.  No stridor. No wheezes. No rales.  CARDIOVASCULAR: Regular rate and rhythm, positive S1-S2, no murmurs rubs or gallops appreciated. ABDOMINAL: Obese, soft, nontender, nondistended, positive bowel sounds in all 4 quadrants, no apparent ascites.  EXTREMITIES: No peripheral edema  HEMATOLOGIC: No significant bruising NEUROLOGIC: Oriented to person, place, and time. Nonfocal. Normal muscle tone.  PSYCHIATRIC: Normal mood and affect. Normal behavior. Cooperative  CARDIAC DATABASE: EKG: 10/31/2020: Normal sinus rhythm, 86 bpm, normal axis, without underlying ischemia or injury pattern.  Echocardiogram: 0/13/2022: Left ventricle cavity is  normal in size and wall thickness. Normal global wall motion. Normal LV systolic function with EF 60%. Indeterminate diastolic filling pattern. Left atrial cavity is mildly dilated. Mild tricuspid regurgitation. Estimated pulmonary artery systolic pressure 42 mmHg.  Stress Testing: No results found for this or any previous visit from the past 1095 days.  Heart Catheterization: None  Coronary CTA 11/12/2020:  1. Total coronary calcium score of 0. 2. Normal coronary origin with right dominance. 3. CAD-RADS = 0 No evidence of CAD (0%). 4. Moderate-sized hiatal hernia. 5. No acute extra cardiac abnormality.  LABORATORY DATA: CBC Latest Ref Rng & Units 04/27/2016 08/11/2015 07/12/2015  WBC 4.0 - 10.5 K/uL 9.0 9.0 6.0  Hemoglobin 12.0 - 15.0 g/dL 07/14/2015 73.2 20.2  Hematocrit 36.0 - 46.0 % 41.9 40.8 39.6  Platelets 150 - 400 K/uL 239 162 214    CMP Latest Ref Rng & Units 04/27/2016 08/12/2015 08/11/2015  Glucose 65 - 99 mg/dL 08/13/2015) 706(C) 376(E)  BUN 6 - 20 mg/dL 10 14 16   Creatinine 0.44 - 1.00 mg/dL 831(D 1.76  Sodium 135 - 145 mmol/L 138 135 134(L)  Potassium 3.5 - 5.1 mmol/L 3.8 4.2 3.9  Chloride 101 - 111 mmol/L 103 109 105  CO2 22 - 32 mmol/L 27 20(L) 22  Calcium 8.9 - 10.3 mg/dL 9.3 8.0(L) 9.6  Total Protein 6.0 - 8.3 g/dL - - -  Total Bilirubin 0.3 - 1.2 mg/dL - - -  Alkaline Phos 39 - 117 U/L - - -  AST 0 - 37 U/L - - -  ALT 0 - 35 U/L - - -    Lipid Panel  No results found for: CHOL, TRIG, HDL, CHOLHDL, VLDL, LDLCALC, LDLDIRECT, LABVLDL  No components found for: NTPROBNP No results for input(s): PROBNP in the last 8760 hours. No results for input(s): TSH in the last 8760 hours.  BMP No results for input(s): NA, K, CL, CO2, GLUCOSE, BUN, CREATININE, CALCIUM, GFRNONAA, GFRAA in the last 8760 hours.  HEMOGLOBIN A1C No results found for: HGBA1C, MPG  External Labs: Collected: 10/23/2020 provided by PCP. Sodium 137, potassium 4.5, chloride 100, bicarb 21, BUN 13,  creatinine 0.8, AST 17, ALT 17, alkaline phosphatase 110. Hemoglobin 13.8 g/dL, hematocrit 41.4%. TSH 1.576  BNP 10.6  D-dimer 0.27 Total cholesterol 176, triglycerides 74, HDL 65, non-HDL 97, LDL 96  IMPRESSION:    ICD-10-CM   1. Precordial pain  R07.2     2. Hx of stroke  Z86.73     3. Class 2 obesity due to excess calories without serious comorbidity with body mass index (BMI) of 38.0 to 38.9 in adult  E66.09    Z68.38        RECOMMENDATIONS: AIDALIZ KESTERSON is a 50 y.o. female whose past medical history and cardiac risk factors include: History of stroke (2010), seizures, headache, Obesity due to excess calories.   Precordial pain ECG nonischemic Chest pain: None. Echo: LVEF 60%, indeterminate diastolic filling pattern, mild LAE, mild TR, PASP 42 mmHg Coronary CTA: Total CAC 0, no evidence of CAD. No additional cardiovascular testing warranted at this time. Educated on the importance of improving her modifiable cardiovascular risk factors. No need for aspirin 81 mg p.o. daily from a cardiovascular standpoint.  However, may be still beneficial given her history of stroke.  Will defer to primary team.  Hx of stroke Consider continuing aspirin 81 mg p.o. daily given the history of stroke. Patient's LDL is greater than 70 mg/dL.  Consider statin therapy given the history of stroke. Patient is recommended to hold off Lopressor for now and to keep a log of her blood pressures.  If her blood pressures are elevated would recommend initiating therapy for benign essential hypertension starting with thiazide diuretics to help facilitate diuresis as the echocardiogram notes indeterminate diastolic dysfunction and elevated PASP.  If additional antihypertensive medications are warranted may consider ACE inhibitor's, metoprolol, etc in the future. Will defer management to PCP at this time.  Class 2 obesity due to excess calories without serious comorbidity with body mass index (BMI) of 38.0  to 38.9 in adult Body mass index is 39.47 kg/m. I reviewed with the patient the importance of diet, regular physical activity/exercise, weight loss.   Patient is educated on increasing physical activity gradually as tolerated.  With the goal of moderate intensity exercise for 30 minutes a day 5 days a week.   Patient is doing well from a cardiovascular standpoint.  Results of the most recent diagnostic testing reviewed with her in great detail including the images.  The shared decision was to follow-up on an annual basis given her risk factors or sooner if change in clinical status.  Patient is greatly recommended to follow-up with PCP for  the above-mentioned reasons.  FINAL MEDICATION LIST END OF ENCOUNTER: No orders of the defined types were placed in this encounter.   There are no discontinued medications.    Current Outpatient Medications:    ASPIRIN LOW DOSE 81 MG EC tablet, Take 81 mg by mouth daily., Disp: , Rfl:    cyclobenzaprine (FLEXERIL) 10 MG tablet, Take 10 mg by mouth 2 (two) times daily as needed., Disp: , Rfl:    esomeprazole (NEXIUM) 40 MG capsule, Take 40 mg by mouth daily., Disp: , Rfl:    HYDROcodone-acetaminophen (NORCO) 7.5-325 MG per tablet, Take 1 tablet by mouth 2 (two) times daily as needed for moderate pain. , Disp: , Rfl:    ibuprofen (ADVIL) 600 MG tablet, Take 1 tablet (600 mg total) by mouth every 8 (eight) hours as needed., Disp: 15 tablet, Rfl: 0   metoprolol tartrate (LOPRESSOR) 25 MG tablet, Take 25 mg by mouth 2 (two) times daily., Disp: , Rfl:    Multiple Vitamin (MULTIVITAMIN WITH MINERALS) TABS, Take 1 tablet by mouth daily., Disp: , Rfl:    nitroGLYCERIN (NITROSTAT) 0.4 MG SL tablet, Place 1 tablet (0.4 mg total) under the tongue every 5 (five) minutes as needed for chest pain. If you require more than two tablets five minutes apart go to the nearest ER via EMS., Disp: 30 tablet, Rfl: 0  No orders of the defined types were placed in this  encounter.   There are no Patient Instructions on file for this visit.   --Continue cardiac medications as reconciled in final medication list. --Return in about 1 year (around 12/01/2021) for Annual follow-up visit.. Or sooner if needed. --Continue follow-up with your primary care physician regarding the management of your other chronic comorbid conditions.  Patient's questions and concerns were addressed to her satisfaction. She voices understanding of the instructions provided during this encounter.   This note was created using a voice recognition software as a result there may be grammatical errors inadvertently enclosed that do not reflect the nature of this encounter. Every attempt is made to correct such errors.  Rex Kras, [place], [facility]  Pager: [phone] Office: [phone]

## 2021-11-10 ENCOUNTER — Other Ambulatory Visit: Payer: Self-pay | Admitting: Nurse Practitioner

## 2021-11-10 DIAGNOSIS — Z1231 Encounter for screening mammogram for malignant neoplasm of breast: Secondary | ICD-10-CM

## 2021-12-22 ENCOUNTER — Encounter: Payer: Self-pay | Admitting: Internal Medicine

## 2022-10-17 IMAGING — DX DG TIBIA/FIBULA 2V*R*
1 series · 3 of 3 positions shown · non-contrast
Comparison: Right ankle series 09/02/2016.

CLINICAL DATA: 50-year-old female status post fall 9 days ago with
continued pain.

EXAM:
RIGHT TIBIA AND FIBULA - 2 VIEW

[Series 1: leg · 0.14mm/px · 3 of 3 slices shown]
[im 1/3]
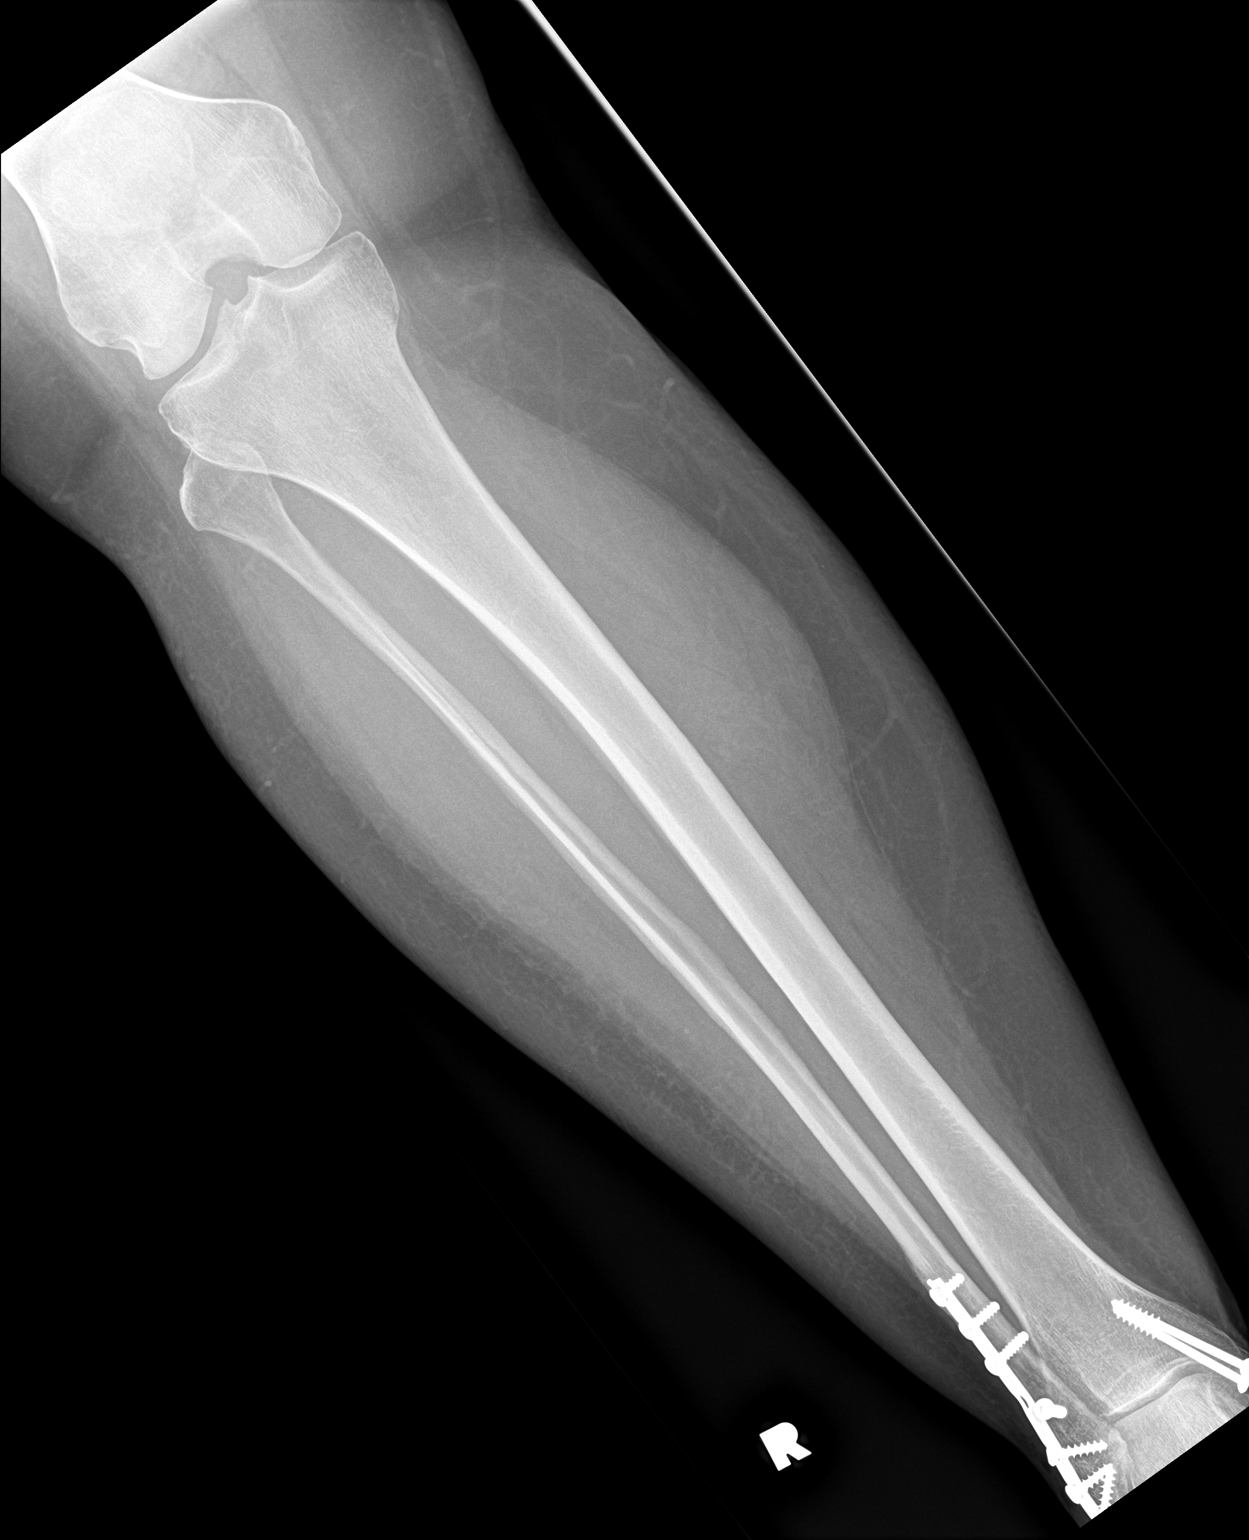
[im 2/3]
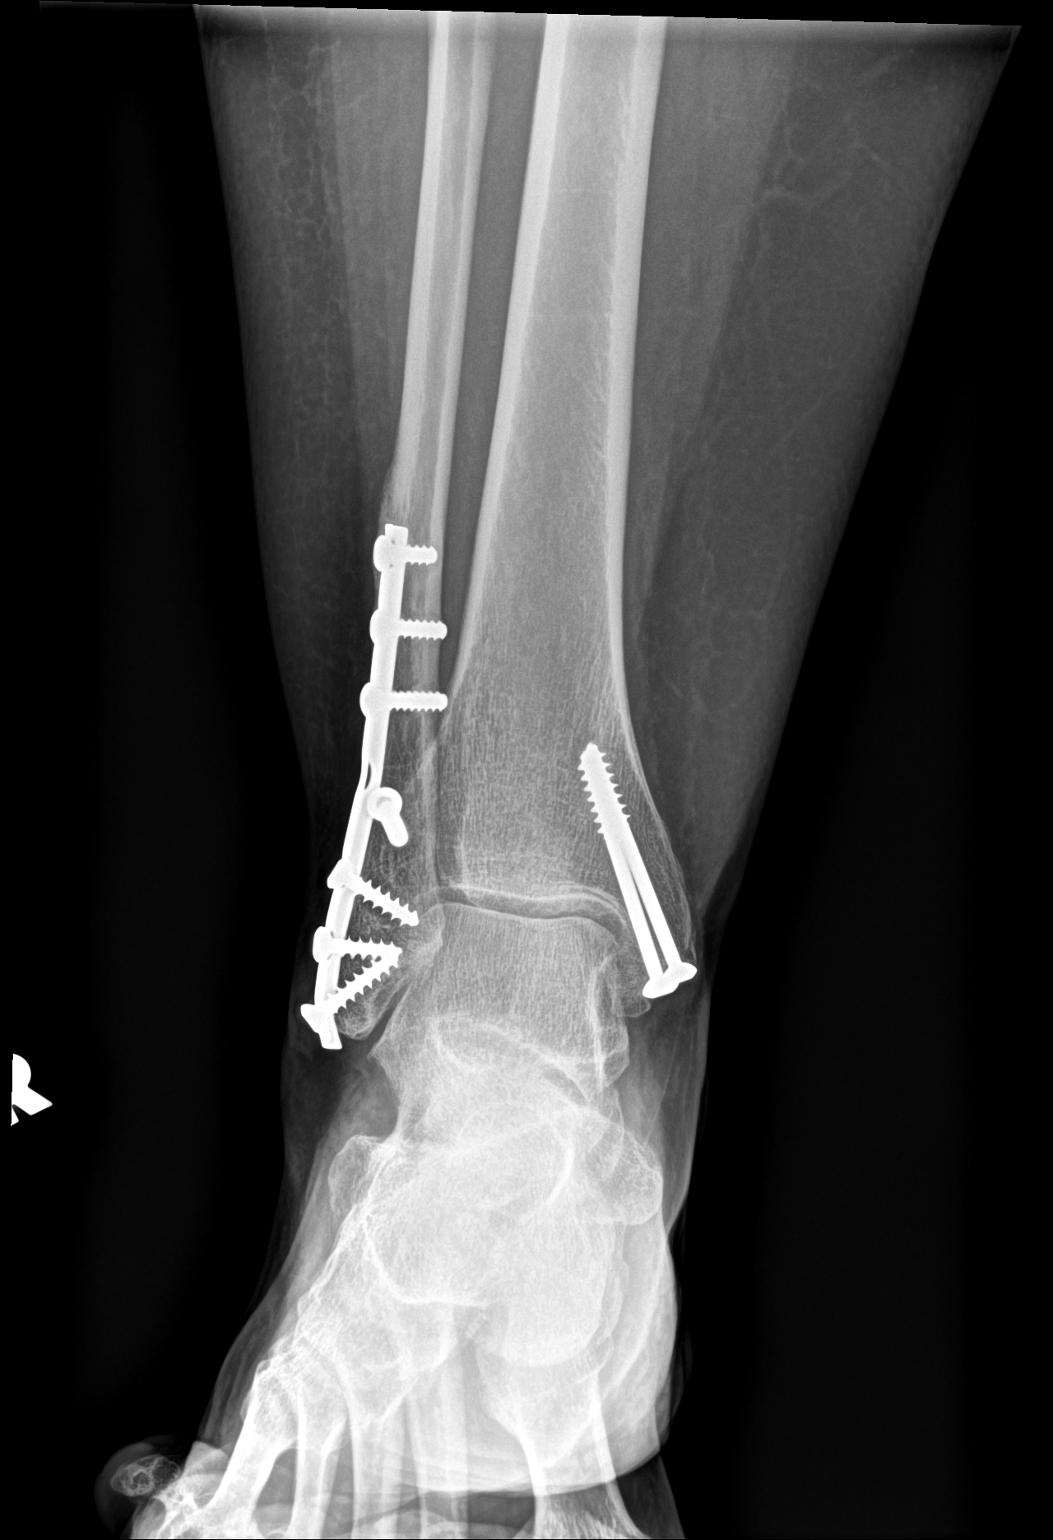
[im 3/3]
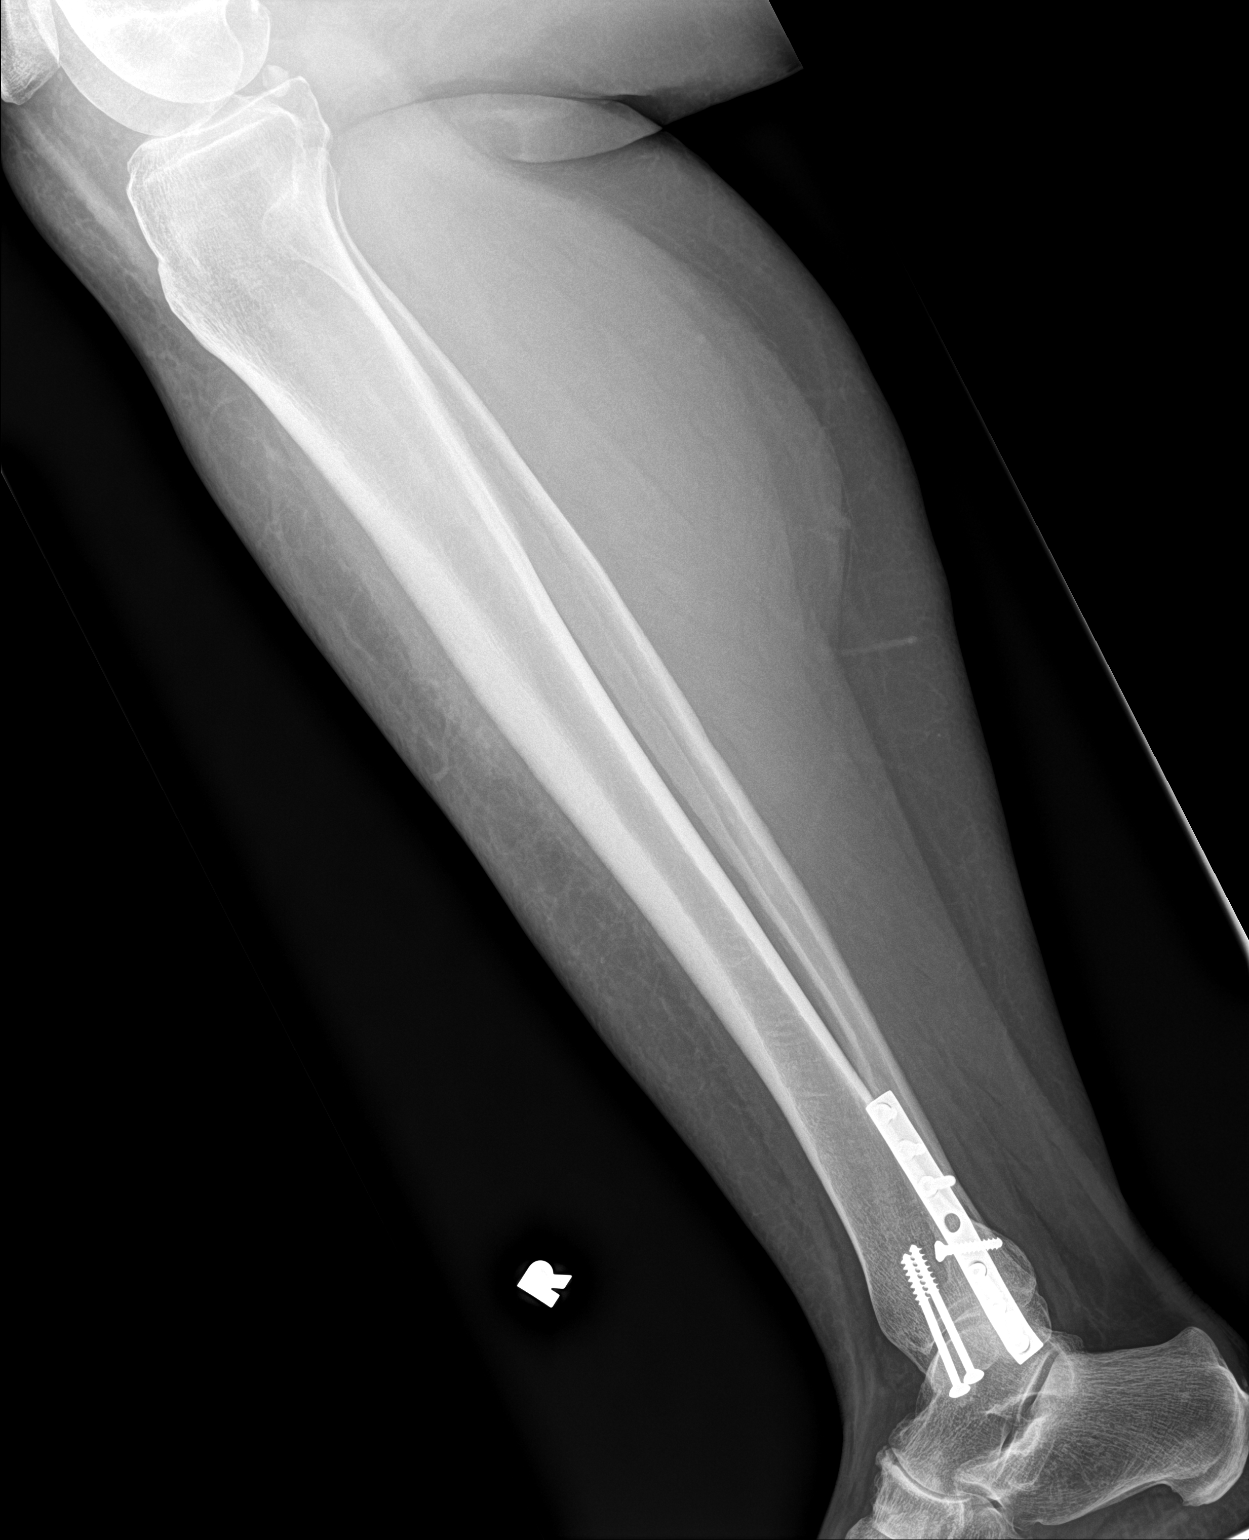

[3 of 3 positions shown; findings below may reference images not displayed]

FINDINGS: Chronic distal tibia and fibula ORIF hardware appears stable and
intact. Preserved alignment at the right knee and ankle. Underlying
normal bone mineralization. No ankle joint effusion. No acute
osseous abnormality identified. Visible calcaneus intact. There is
joint space loss and degenerative spurring at the talonavicular
joint. No discrete soft tissue injury.
IMPRESSION: No acute fracture or dislocation identified about the right tib-fib.

## 2022-12-23 IMAGING — CT CT HEART MORP W/ CTA COR W/ SCORE W/ CA W/CM &/OR W/O CM
4 of 7 series · 9 of 20 positions shown, 10 images · IV contrast (APPLIED)
Comparison: 08/11/2015
COMPARISON: 08/11/2015

Addendum:
EXAM:
OVER-READ INTERPRETATION  CT CHEST

The following report is an over-read performed by radiologist Dr.
Elsart Vils [REDACTED] on 11/12/2020. This
over-read does not include interpretation of cardiac or coronary
anatomy or pathology. The coronary CTA interpretation by the
cardiologist is attached.
HISTORY: Chest pain, nonspecific
Cardiac/Coronary  CT
TECHNIQUE: The patient was scanned on a Siemens Force scanner.
PROTOCOL: A 120 kV prospective scan was triggered in the descending thoracic
aorta at 111 HU's. Axial non-contrast 3 mm slices were carried out
through the heart. The data set was analyzed on a dedicated work
station and scored using the Agatson method. Gantry rotation speed
was 250 msecs and collimation was .6 mm. Both IV beta blockade and
calcium channel blockers were administered and 0.8 mg of sl NTG was
given. The 3D data set was reconstructed in 5% intervals of the
67-82 % of the R-R cycle. Diastolic phases were analyzed on a
dedicated work station using MPR, MIP and VRT modes. The patient
received 95mL OMNIPAQUE IOHEXOL 350 MG/ML SOLN of contrast.

[Series 6: best diast 74 % · axial · 0.37mm/px · z∈[-305,-238]mm · 3 of 336 slices shown, 4 images]
[im 84/336  vessel]
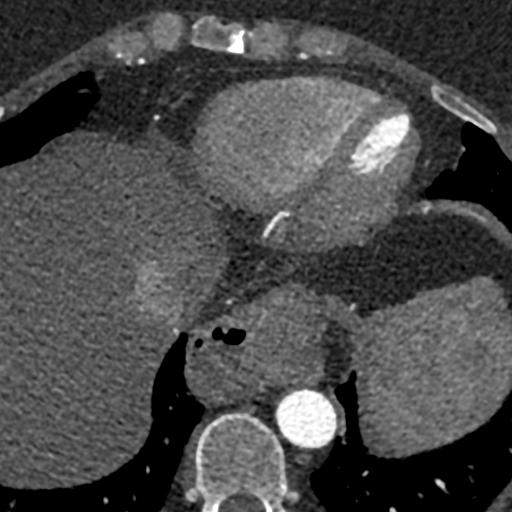
[im 84/336  lung]
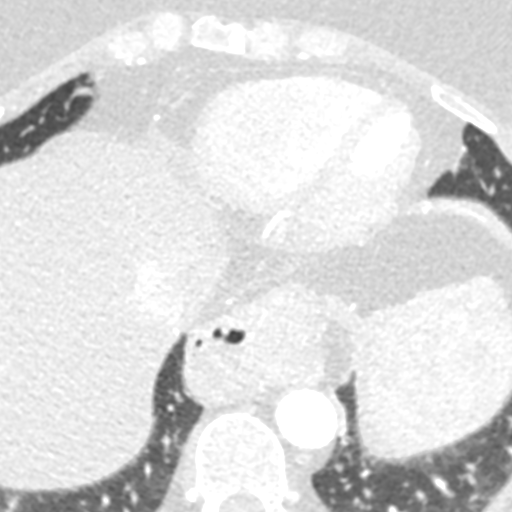
[im 168/336  vessel]
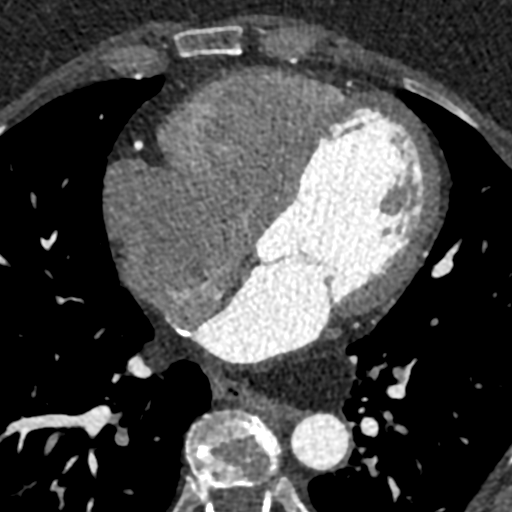
[im 252/336  vessel]
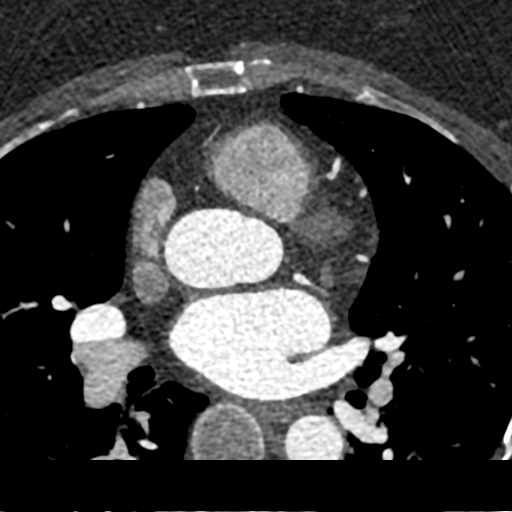

[Series 7: best syst 37 % · axial · 0.37mm/px · z∈[-282,-243]mm · 2 of 291 slices shown]
[im 97/291  vessel]
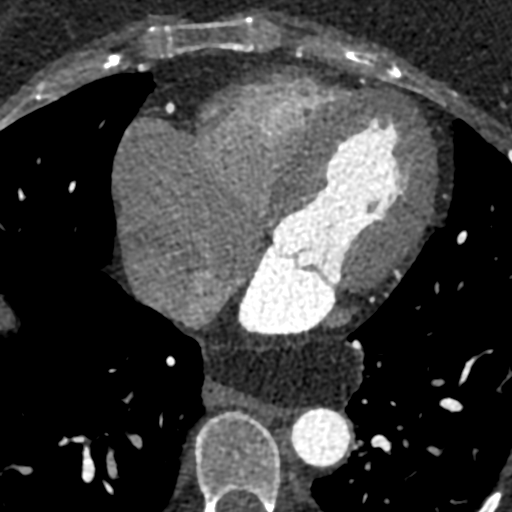
[im 194/291  vessel]
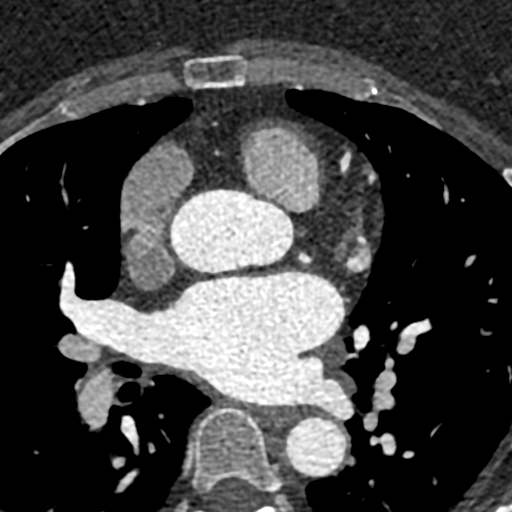

[Series 8: ts diast sharp 74 % · axial · 0.37mm/px · z∈[-282,-243]mm · 2 of 291 slices shown]
[im 97/291  lung]
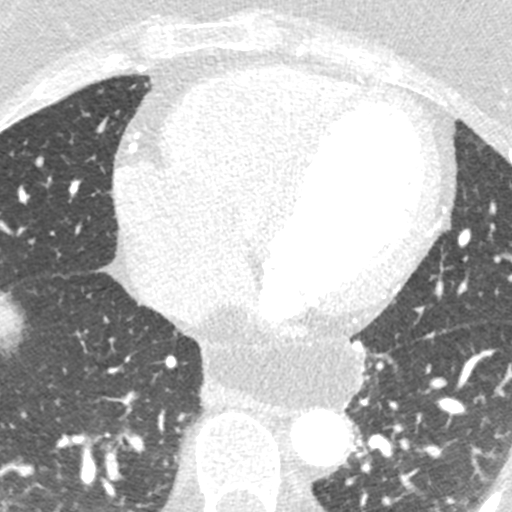
[im 194/291  lung]
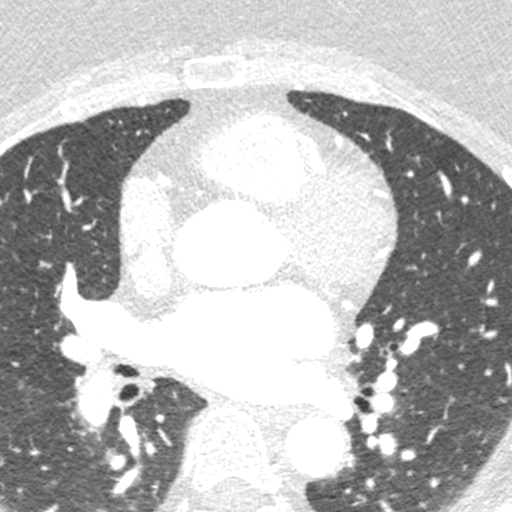

[Series 9: ts syst sharp 37 % · axial · 0.37mm/px · z∈[-282,-243]mm · 2 of 291 slices shown]
[im 97/291  lung]
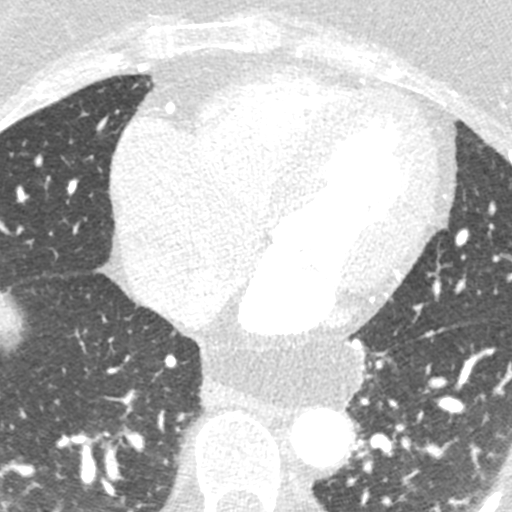
[im 194/291  lung]
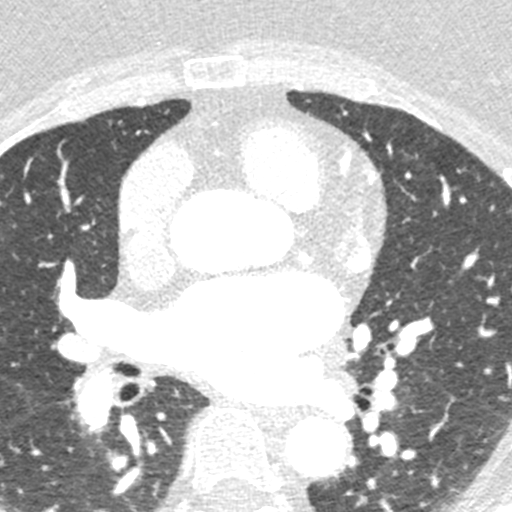

[9 of 20 positions shown; findings below may reference images not displayed]

FINDINGS: Vascular: Heart is normal size.  Aorta normal caliber.

Mediastinum/Nodes: No adenopathy

Lungs/Pleura: No confluent opacities or effusions.

Upper Abdomen: No acute findings moderate-sized hiatal hernia.

Musculoskeletal: Chest wall soft tissues are unremarkable. No acute
bony abnormality.
IMPRESSION: Moderate-sized hiatal hernia.

No acute extra cardiac abnormality.
FINDINGS: Image quality: Average.

Artifact: Present.

Total coronary calcium score of 0.

Coronary arteries: Normal coronary origins.  Right dominance.

Left Main Coronary Artery: The left main is a normal caliber vessel
with a normal take off from the left coronary cusp that trifurcates
into a LAD, LCX, and ramus intermedius. There is no plaque or
stenosis.

Left Anterior Descending Coronary Artery: Normal caliber vessel,
wraps the apex, gives off 1 patent diagonal branches. The LAD is
patent without evidence of plaque or stenosis.

Ramus intermedius: Normal caliber vessel, patent with no evidence of
plaque or stenosis.

Left Circumflex Artery: Normal caliber vessel, non-dominant, travels
within the atrioventricular groove, gives off 1 patent obtuse
marginal branches. The LCX is patent with no evidence of plaque or
stenosis.

Right Coronary Artery: The RCA is dominant with normal take off from
the right coronary cusp. The RCA terminates as a PDA and right
posterolateral branch without evidence of plaque or stenosis.

Left Atrium: Grossly normal in size with no left atrial appendage
filling defect.

Left Ventricle: Grossly normal in size. There are no stigmata of
prior infarction. There is no abnormal filling defect.

Pulmonary arteries: Normal in size without proximal filling defect.

Pulmonary veins: Normal pulmonary venous drainage.

Aorta: Normal size, 29.8 mm at the mid ascending aorta (level of the
PA bifurcation) measured double oblique. No calcifications. No
dissection.

Pericardium: Normal thickness with no significant effusion or
calcium present.

Cardiac valves: The aortic valve is trileaflet without
calcification. The mitral valve is normal structure without
calcification.

Extra-cardiac findings: See attached radiology report for
non-cardiac structures.
IMPRESSION: 1. Total coronary calcium score of 0.

2. Normal coronary origin with right dominance.

3. CAD-RADS = 0 No evidence of CAD (0%).

RECOMMENDATIONS:

Consider non-atherosclerotic causes of chest pain.

*** End of Addendum ***
EXAM:
OVER-READ INTERPRETATION  CT CHEST

The following report is an over-read performed by radiologist Dr.
Elsart Vils [REDACTED] on 11/12/2020. This
over-read does not include interpretation of cardiac or coronary
anatomy or pathology. The coronary CTA interpretation by the
cardiologist is attached.
FINDINGS: Vascular: Heart is normal size.  Aorta normal caliber.

Mediastinum/Nodes: No adenopathy

Lungs/Pleura: No confluent opacities or effusions.

Upper Abdomen: No acute findings moderate-sized hiatal hernia.

Musculoskeletal: Chest wall soft tissues are unremarkable. No acute
bony abnormality.
IMPRESSION: Moderate-sized hiatal hernia.

No acute extra cardiac abnormality.

## 2023-09-16 ENCOUNTER — Other Ambulatory Visit: Payer: Self-pay | Admitting: Nurse Practitioner

## 2023-09-16 DIAGNOSIS — Z1231 Encounter for screening mammogram for malignant neoplasm of breast: Secondary | ICD-10-CM

## 2023-11-04 ENCOUNTER — Ambulatory Visit
Admission: RE | Admit: 2023-11-04 | Discharge: 2023-11-04 | Disposition: A | Discharge status: Home / Self Care | Source: Ambulatory Visit | Attending: Nurse Practitioner | Admitting: Nurse Practitioner

## 2023-11-04 DIAGNOSIS — Z1231 Encounter for screening mammogram for malignant neoplasm of breast: Secondary | ICD-10-CM

## 2023-11-06 ENCOUNTER — Ambulatory Visit: Admission: EM | Admit: 2023-11-06 | Discharge: 2023-11-06 | Disposition: A | Discharge status: Home / Self Care

## 2023-11-06 DIAGNOSIS — K047 Periapical abscess without sinus: Secondary | ICD-10-CM | POA: Diagnosis not present

## 2023-11-06 MED ORDER — AMOXICILLIN-POT CLAVULANATE 875-125 MG PO TABS: 1.0000 | ORAL_TABLET | Freq: Two times a day (BID) | ORAL | 0 refills | Status: AC

## 2023-11-06 MED ORDER — DICLOFENAC SODIUM 50 MG PO TBEC: 50.0000 mg | DELAYED_RELEASE_TABLET | Freq: Two times a day (BID) | ORAL | 1 refills | Status: AC

## 2023-11-06 NOTE — ED Triage Notes (Signed)
 Pt reports swelling in the left sided of face due to a broken teeth since this morning. Pt took Tylenol  and ibuprofen .

## 2023-11-06 NOTE — Discharge Instructions (Addendum)
  1. Chronic dental infection (Primary) - diclofenac (VOLTAREN) 50 MG EC tablet; Take 1 tablet (50 mg total) by mouth 2 (two) times daily.  Dispense: 30 tablet; Refill: 1 - amoxicillin-clavulanate (AUGMENTIN) 875-125 MG tablet; Take 1 tablet by mouth every 12 (twelve) hours.  Dispense: 14 tablet; Refill: 0 - Continue to take Tylenol  650 to 1000 mg every 6 hours as needed for pain secondary to dental fracture and dental infection. - Follow-up with dentist as soon as possible for further evaluation and possible need for extraction of broken tooth. -Continue to monitor symptoms for any change in severity if there is any escalation of current symptoms or development of new symptoms follow-up in ER for further evaluation and management.

## 2023-11-06 NOTE — ED Provider Notes (Signed)
 [facility]  Note:  This document was prepared using Dragon voice recognition software and may include unintentional dictation errors.  MRN: [id number] DOB: Dec 18, 1970  Subjective:   Heather Simon is a 53 y.o. female presenting for evaluation of left-sided facial swelling and dental pain that started this morning.  Patient reports that she has had a broken tooth for quite some time but has not seen a dentist for evaluation and treatment.  Patient has been taking Tylenol  and ibuprofen  together for pain with mild improvement.  Patient was concern for possible infection due to the broken tooth.  No current facility-administered medications for this encounter.  Current Outpatient Medications:    acetaminophen  (TYLENOL ) 325 MG tablet, Take 650 mg by mouth every 6 (six) hours as needed., Disp: , Rfl:    amoxicillin-clavulanate (AUGMENTIN) 875-125 MG tablet, Take 1 tablet by mouth every 12 (twelve) hours., Disp: 14 tablet, Rfl: 0   diclofenac (VOLTAREN) 50 MG EC tablet, Take 1 tablet (50 mg total) by mouth 2 (two) times daily., Disp: 30 tablet, Rfl: 1   ASPIRIN LOW DOSE 81 MG EC tablet, Take 81 mg by mouth daily., Disp: , Rfl:    cyclobenzaprine  (FLEXERIL ) 10 MG tablet, Take 10 mg by mouth 2 (two) times daily as needed., Disp: , Rfl:    esomeprazole (NEXIUM) 40 MG capsule, Take 40 mg by mouth daily., Disp: , Rfl:    HYDROcodone -acetaminophen  (NORCO) 7.5-325 MG per tablet, Take 1 tablet by mouth 2 (two) times daily as needed for moderate pain. , Disp: , Rfl:    ibuprofen  (ADVIL ) 600 MG tablet, Take 1 tablet (600 mg total) by mouth every 8 (eight) hours as needed., Disp: 15 tablet, Rfl: 0   metoprolol  tartrate (LOPRESSOR ) 25 MG tablet, Take 25 mg by mouth 2 (two) times daily., Disp: , Rfl:    Multiple Vitamin (MULTIVITAMIN WITH MINERALS) TABS, Take 1 tablet by mouth daily., Disp: , Rfl:    nitroGLYCERIN  (NITROSTAT ) 0.4 MG SL tablet, Place 1 tablet (0.4 mg total) under the tongue every 5  (five) minutes as needed for chest pain. If you require more than two tablets five minutes apart go to the nearest ER via EMS., Disp: 30 tablet, Rfl: 0   Allergies  Allergen Reactions   Lyrica  [Pregabalin ] Other (See Comments)    Altered mental state/ weight gain     Past Medical History:  Diagnosis Date   Anxiety, mild    Arthritis    Bulging disc    noncompressive   Chronic headaches    Depression with anxiety 01/04/2013   Headache(784.0) 01/04/2013   Hearing loss    left ear   Insomnia 01/04/2013   Myalgia and myositis, unspecified 01/04/2013   Obesity    Seizures (HCC)    Stroke (HCC)    Tremor 01/04/2013     Past Surgical History:  Procedure Laterality Date   ANKLE SURGERY     Left, after surgery   FRACTURE SURGERY     TUBAL LIGATION      Family History  Problem Relation Age of Onset   Breast cancer Mother 17   Cancer Mother        breast cancer   Hypertension Mother    COPD Father    Heart disease Father    Cancer Sister    Neurofibromatosis Maternal Aunt    Cancer Maternal Grandmother    Cancer Maternal Grandfather     Social History   Tobacco Use   Smoking status: Never  Smokeless tobacco: Never  Vaping Use   Vaping status: Never Used  Substance Use Topics   Alcohol use: Not Currently   Drug use: No    ROS Refer to HPI for ROS details.  Objective:    Vitals: BP 110/75 (BP Location: Right Arm)   Pulse 89   Temp 99.1 F (37.3 C) (Oral)   Resp 18   LMP  (Within Months) Comment: 74month  SpO2 95%   Physical Exam Vitals and nursing note reviewed.  Constitutional:      General: She is not in acute distress.    Appearance: Normal appearance. She is not ill-appearing.  HENT:     Head: Normocephalic.     Mouth/Throat:     Dentition: Abnormal dentition (Broken tooth with root exposure at the gumline, mild erythema surrounding broken tooth.). Dental tenderness, gingival swelling, dental caries and dental abscesses present.    Cardiovascular:     Rate and Rhythm: Normal rate.  Pulmonary:     Effort: Pulmonary effort is normal. No respiratory distress.  Skin:    General: Skin is warm and dry.     Capillary Refill: Capillary refill takes less than 2 seconds.  Neurological:     General: No focal deficit present.     Mental Status: She is alert and oriented to person, place, and time.  Psychiatric:        Mood and Affect: Mood normal.        Behavior: Behavior normal.     Procedures  No results found for this or any previous visit (from the past 24 hours).  Assessment and Plan :     Discharge Instructions       1. Chronic dental infection (Primary) - diclofenac (VOLTAREN) 50 MG EC tablet; Take 1 tablet (50 mg total) by mouth 2 (two) times daily.  Dispense: 30 tablet; Refill: 1 - amoxicillin-clavulanate (AUGMENTIN) 875-125 MG tablet; Take 1 tablet by mouth every 12 (twelve) hours.  Dispense: 14 tablet; Refill: 0 - Continue to take Tylenol  650 to 1000 mg every 6 hours as needed for pain secondary to dental fracture and dental infection. - Follow-up with dentist as soon as possible for further evaluation and possible need for extraction of broken tooth. -Continue to monitor symptoms for any change in severity if there is any escalation of current symptoms or development of new symptoms follow-up in ER for further evaluation and management.       Juletta Berhe B Sewell Pitner   Khayla Koppenhaver, [place] B, [place] 11/06/23 1038
